# Patient Record
Sex: Female | Born: 2000 | Race: White | Hispanic: No | Marital: Single | Smoking: Never smoker
Health system: Southern US, Community
[De-identification: ages and names within clinical notes are randomized; demographics above are authoritative.]

## PROBLEM LIST (undated history)

## (undated) DIAGNOSIS — D649 Anemia, unspecified: Secondary | ICD-10-CM

## (undated) DIAGNOSIS — E559 Vitamin D deficiency, unspecified: Secondary | ICD-10-CM

## (undated) HISTORY — PX: NO PAST SURGERIES: SHX2092

## (undated) HISTORY — DX: Anemia, unspecified: D64.9

## (undated) HISTORY — DX: Vitamin D deficiency, unspecified: E55.9

---

## 2019-12-30 ENCOUNTER — Encounter (HOSPITAL_COMMUNITY): Payer: Self-pay

## 2019-12-30 ENCOUNTER — Ambulatory Visit (INDEPENDENT_AMBULATORY_CARE_PROVIDER_SITE_OTHER): Payer: Managed Care, Other (non HMO)

## 2019-12-30 ENCOUNTER — Ambulatory Visit (HOSPITAL_COMMUNITY)
Admission: EM | Admit: 2019-12-30 | Discharge: 2019-12-30 | Disposition: A | Discharge status: Home / Self Care | Payer: Managed Care, Other (non HMO) | Attending: Family Medicine | Admitting: Family Medicine

## 2019-12-30 DIAGNOSIS — S96912A Strain of unspecified muscle and tendon at ankle and foot level, left foot, initial encounter: Secondary | ICD-10-CM

## 2019-12-30 DIAGNOSIS — Z3202 Encounter for pregnancy test, result negative: Secondary | ICD-10-CM | POA: Diagnosis not present

## 2019-12-30 DIAGNOSIS — R600 Localized edema: Secondary | ICD-10-CM | POA: Diagnosis not present

## 2019-12-30 DIAGNOSIS — M79672 Pain in left foot: Secondary | ICD-10-CM | POA: Diagnosis not present

## 2019-12-30 LAB — POC URINE PREG, ED: Preg Test, Ur: NEGATIVE

## 2019-12-30 LAB — POCT PREGNANCY, URINE: Preg Test, Ur: NEGATIVE

## 2019-12-30 MED ORDER — METHYLPREDNISOLONE SODIUM SUCC 125 MG IJ SOLR
INTRAMUSCULAR | Status: AC
  Filled 2019-12-30: qty 2

## 2019-12-30 MED ORDER — METHYLPREDNISOLONE SODIUM SUCC 125 MG IJ SOLR
80.0000 mg | Freq: Once | INTRAMUSCULAR | Status: AC
  Administered 2019-12-30: 17:00:00 80 mg via INTRAMUSCULAR

## 2019-12-30 MED ORDER — KETOROLAC TROMETHAMINE 60 MG/2ML IM SOLN
INTRAMUSCULAR | Status: AC
  Filled 2019-12-30: qty 2

## 2019-12-30 MED ORDER — NAPROXEN 500 MG PO TABS: 500.0000 mg | ORAL_TABLET | Freq: Two times a day (BID) | ORAL | 0 refills | Status: DC | PRN

## 2019-12-30 MED ORDER — KETOROLAC TROMETHAMINE 60 MG/2ML IM SOLN
60.0000 mg | Freq: Once | INTRAMUSCULAR | Status: AC
  Administered 2019-12-30: 60 mg via INTRAMUSCULAR

## 2019-12-30 NOTE — ED Provider Notes (Signed)
[place]    CSN: [id number] Arrival date & time: 12/30/19  1247      History   Chief Complaint Chief Complaint  Patient presents with  . Foot Pain    Left    HPI Christine Wells is a 19 y.o. female.   HPI  Christine Wells presents with left foot pain following a slip and fall 3 days in which her body weight  landed on her left foot. She recalls hearing a "pop" during the injury. She did experience pain immediately following injury.  Today she is unable to bear-weight without experience significant discomfort. Left foot and ankle is negative for bruising. Left foot an ankle have become edematous since injury occurred. Pain is localized to the left  lateral, medial malleolus, tallus and hindfoot. She has not taken any medication today, although achieved temporary relief with ice applications during the night. No prior fracture or injury to left foot or ankle.  History reviewed. No pertinent past medical history.  There are no problems to display for this patient.   OB History   No obstetric history on file.      Home Medications    Prior to Admission medications   Not on File    Family History Family History  Problem Relation Age of Onset  . Hypertension Mother   . Healthy Father     Social History Social History   Tobacco Use  . Smoking status: Never Smoker  . Smokeless tobacco: Never Used  Substance Use Topics  . Alcohol use: Never  . Drug use: Not on file     Allergies   Patient has no known allergies.   Review of Systems Review of Systems Pertinent negatives listed in HPI  Physical Exam Triage Vital Signs ED Triage Vitals  Enc Vitals Group     BP 12/30/19 1431 132/84     Pulse Rate 12/30/19 1431 88     Resp 12/30/19 1431 16     Temp 12/30/19 1431 98.4 F (36.9 C)     Temp Source 12/30/19 1431 Oral     SpO2 12/30/19 1431 99 %     Weight --      Height --      Head Circumference --      Peak Flow --      Pain Score 12/30/19 1443  4     Pain Loc --      Pain Edu? --      Excl. in [place]? --    No data found.  Updated Vital Signs BP 132/84 (BP Location: Left Arm)   Pulse 88   Temp 98.4 F (36.9 C) (Oral)   Resp 16   SpO2 99%   Visual Acuity Right Eye Distance:   Left Eye Distance:   Bilateral Distance:    Right Eye Near:   Left Eye Near:    Bilateral Near:     Physical Exam   UC Treatments / Results  Labs (all labs ordered are listed, but only abnormal results are displayed) Labs Reviewed  POC URINE PREG, ED  POCT PREGNANCY, URINE    EKG   Radiology DG Foot Complete Left  Result Date: 12/30/2019 CLINICAL DATA:  Fall 3 days ago, pain with weight-bearing EXAM: LEFT FOOT - COMPLETE 3+ VIEW COMPARISON:  None. FINDINGS: No fracture or dislocation of the left foot. Joint spaces are well preserved. Mild soft tissue edema about the foot. IMPRESSION: No fracture or dislocation of the left foot. Mild soft  tissue edema. Electronically Signed   By: Lauralyn Primes M.D.   On: 12/30/2019 15:41    Procedures Procedures (including critical care time)  Medications Ordered in UC Medications  ketorolac (TORADOL) injection 60 mg (60 mg Intramuscular Given 12/30/19 1642)  methylPREDNISolone sodium succinate (SOLU-MEDROL) 125 mg/2 mL injection 80 mg (80 mg Intramuscular Given 12/30/19 1640)    Initial Impression / Assessment and Plan / UC Course  I have reviewed the triage vital signs and the nursing notes.  Pertinent labs & imaging results that were available during my care of the patient were reviewed by me and considered in my medical decision making (see chart for details).    Left ankle strain, Toradol and Solumedrol injections provided in office today to reduce inflammation and for management of pain. Prescribed Naproxen 500 mg BID, start tomorrow. Recommended RICE. If no improvement return for further evaluation. Patient verbalized understanding and agreement with plan. Work note provided.  Final Clinical  Impressions(s) / UC Diagnoses   Final diagnoses:  Left foot pain  Edema of left foot  Strain of left ankle, initial encounter     Discharge Instructions     Continue apply ice applications. I recommend refraining from weight-bearing activities for 48 hours. Work note provided. Take Naprosyn 500 mg twice daily as needed for pain (start tomorrow) you received antiinflammatory today in clinic today.    ED Prescriptions    Medication Sig Dispense Auth. Provider   naproxen (NAPROSYN) 500 MG tablet Take 1 tablet (500 mg total) by mouth 2 (two) times daily as needed. 30 tablet Bing Neighbors, FNP     PDMP not reviewed this encounter.   Bing Neighbors, [place] 01/01/20 2042

## 2019-12-30 NOTE — Discharge Instructions (Addendum)
Continue apply ice applications. I recommend refraining from weight-bearing activities for 48 hours. Work note provided. Take Naprosyn 500 mg twice daily as needed for pain (start tomorrow) you received antiinflammatory today in clinic today.

## 2019-12-30 NOTE — ED Triage Notes (Signed)
Patient presents to Urgent Care with complaints of left foot pain and swelling since slipping and landing on her foot three days ago. Patient reports it is painful to ambulate but she is able.

## 2020-10-09 IMAGING — DX DG FOOT COMPLETE 3+V*L*
3 series · 3 of 3 positions shown · non-contrast
Comparison: None.

CLINICAL DATA: Fall 3 days ago, pain with weight-bearing

EXAM:
LEFT FOOT - COMPLETE 3+ VIEW

[foot ap]
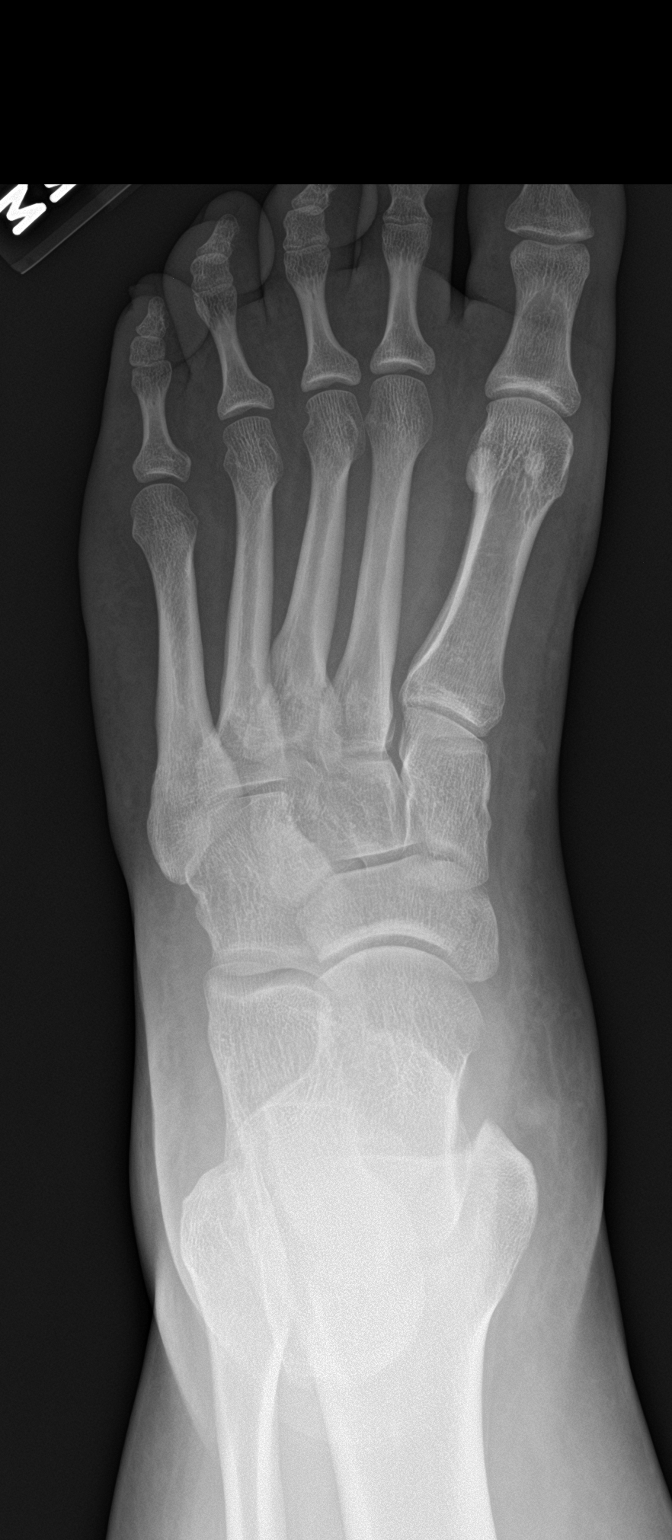

[foot obl]
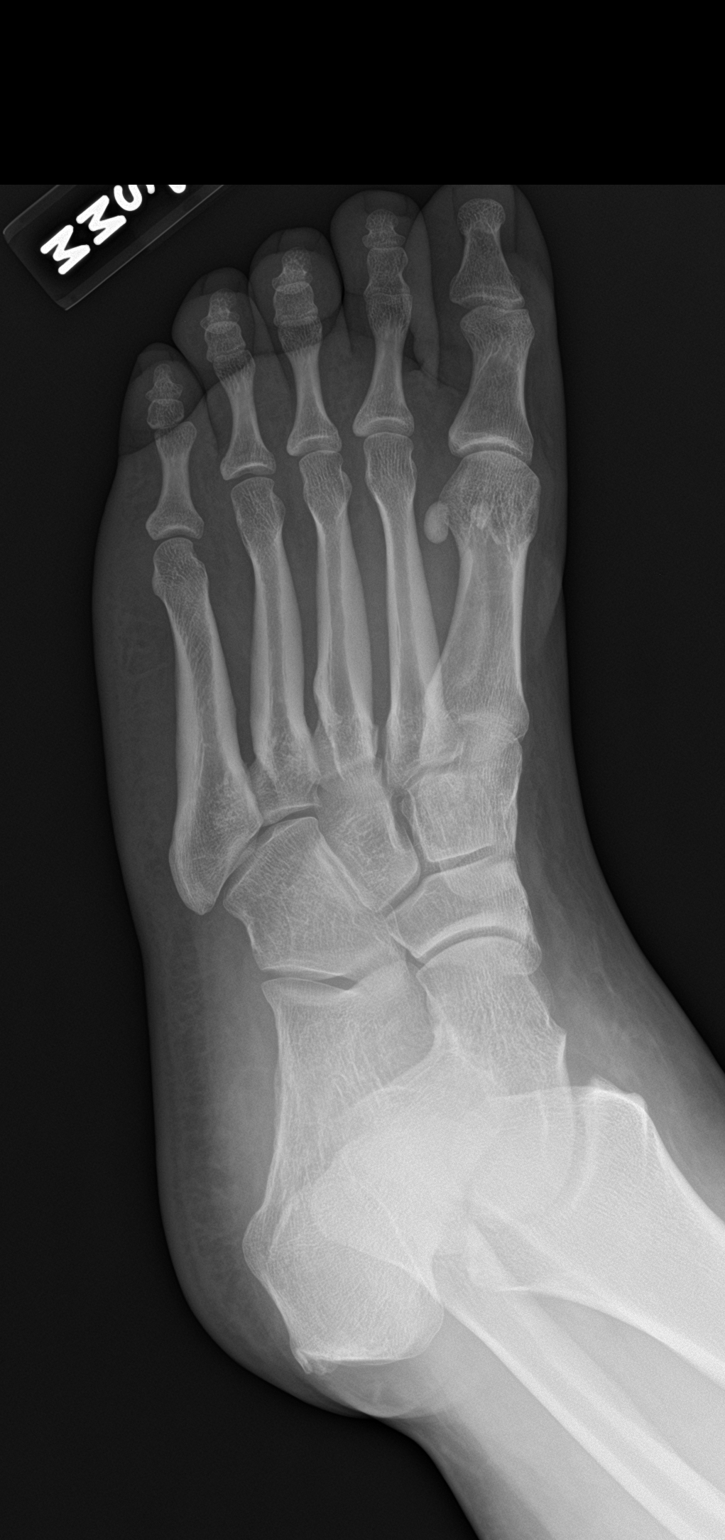

[foot lat]
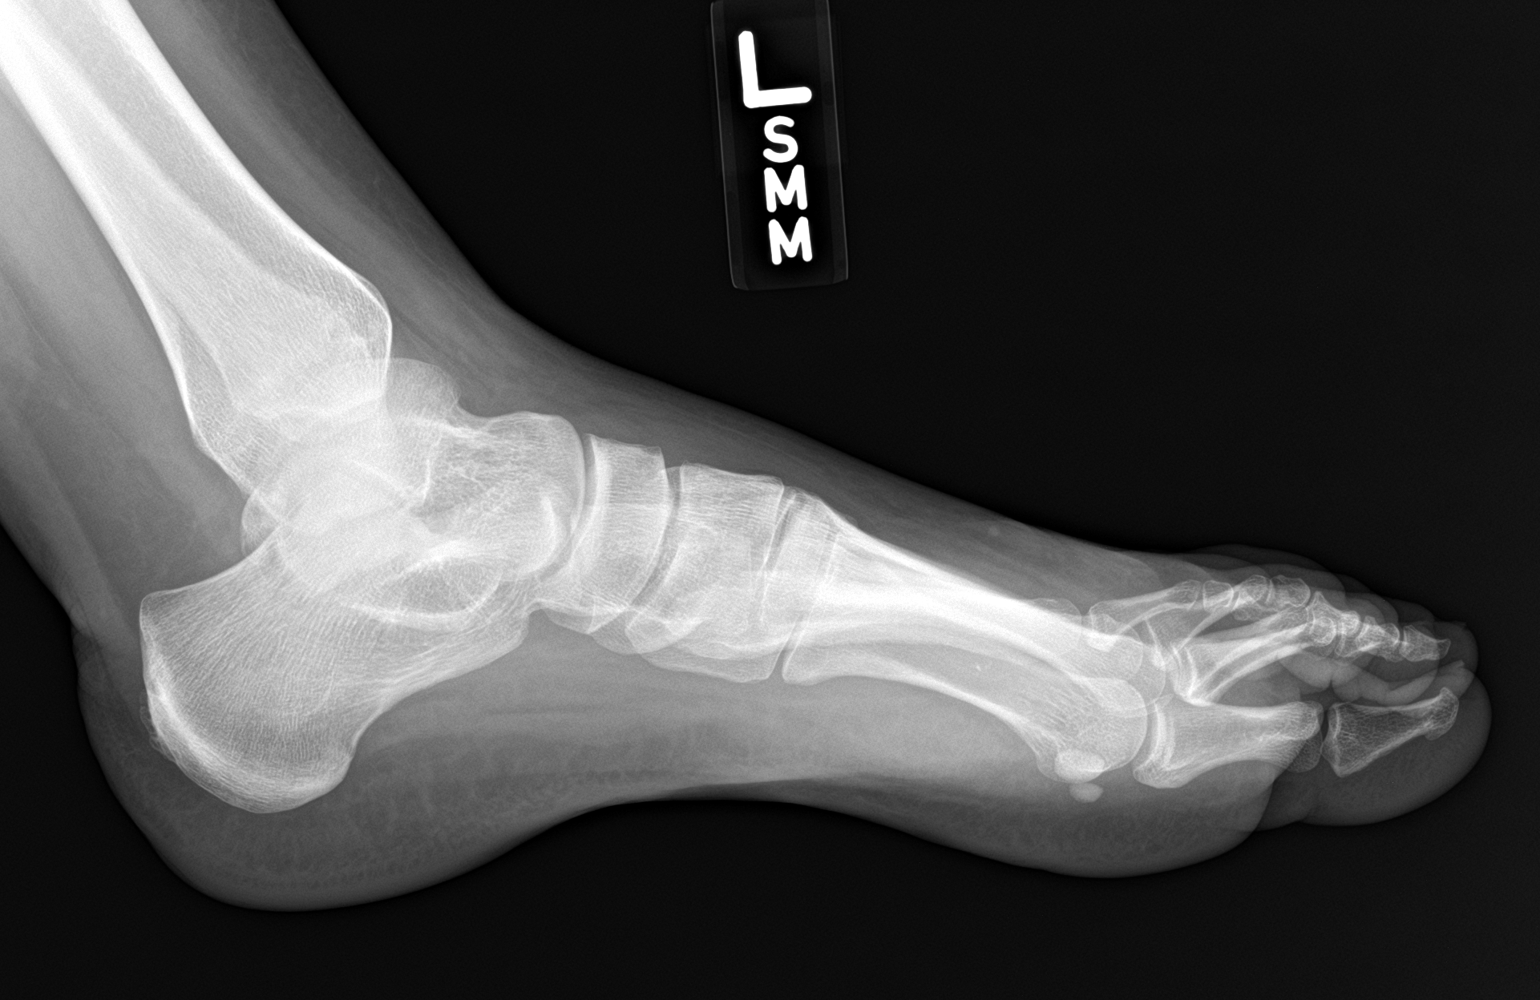

[3 of 3 positions shown; findings below may reference images not displayed]

FINDINGS: No fracture or dislocation of the left foot. Joint spaces are well
preserved. Mild soft tissue edema about the foot.
IMPRESSION: No fracture or dislocation of the left foot. Mild soft tissue edema.

## 2022-12-27 NOTE — L&D Delivery Note (Signed)
Delivery Note At 3:34 AM a viable female was delivered via Vaginal, Spontaneous (Presentation: Right Occiput Anterior).  APGAR: 8, 9; weight  7lbs 7.2 oz.    No nuchal cord noted.  Baby's nose and mouth suctioned at perineum.  Shoulders were delivered without difficulty, Left shoulder was anterior.  Upon delivery, baby was placed on mother's abdomen where it was dried and stimulated.  Pitocin was started at this point.  Baby had a spontaneous cry and good tone noted.  Cord was clamped after 1 minute. Cord cut by father of baby in the room.  Placenta delivered with gentle traction on cord and abdominal counter traction  Placenta status: Spontaneous, Intact.  Cord: 3 vessels with the following complications: None.  Cord pH: not collected.  Anesthesia:  Epidural. Episiotomy: None Lacerations: 1st degree Suture Repair: 3.0 vicryl Est. Blood Loss (mL): 200  Mom to postpartum.  Baby to Couplet care / Skin to Skin.  Prescilla Sours, MD.  10/06/2023, 3:54 AM

## 2023-02-25 DIAGNOSIS — Z419 Encounter for procedure for purposes other than remedying health state, unspecified: Secondary | ICD-10-CM | POA: Diagnosis not present

## 2023-03-21 DIAGNOSIS — Z124 Encounter for screening for malignant neoplasm of cervix: Secondary | ICD-10-CM | POA: Diagnosis not present

## 2023-03-21 DIAGNOSIS — N912 Amenorrhea, unspecified: Secondary | ICD-10-CM | POA: Diagnosis not present

## 2023-03-21 DIAGNOSIS — Z113 Encounter for screening for infections with a predominantly sexual mode of transmission: Secondary | ICD-10-CM | POA: Diagnosis not present

## 2023-03-21 DIAGNOSIS — Z362 Encounter for other antenatal screening follow-up: Secondary | ICD-10-CM | POA: Diagnosis not present

## 2023-03-21 DIAGNOSIS — Z3490 Encounter for supervision of normal pregnancy, unspecified, unspecified trimester: Secondary | ICD-10-CM | POA: Diagnosis not present

## 2023-03-21 LAB — HEPATITIS C ANTIBODY: HCV Ab: NEGATIVE

## 2023-03-21 LAB — OB RESULTS CONSOLE ABO/RH: RH Type: POSITIVE

## 2023-03-21 LAB — OB RESULTS CONSOLE HEPATITIS B SURFACE ANTIGEN: Hepatitis B Surface Ag: NEGATIVE

## 2023-03-21 LAB — OB RESULTS CONSOLE RUBELLA ANTIBODY, IGM: Rubella: IMMUNE

## 2023-03-21 LAB — OB RESULTS CONSOLE HIV ANTIBODY (ROUTINE TESTING): HIV: NONREACTIVE

## 2023-03-21 LAB — OB RESULTS CONSOLE RPR: RPR: NONREACTIVE

## 2023-03-21 LAB — OB RESULTS CONSOLE ANTIBODY SCREEN: Antibody Screen: NEGATIVE

## 2023-03-28 DIAGNOSIS — Z419 Encounter for procedure for purposes other than remedying health state, unspecified: Secondary | ICD-10-CM | POA: Diagnosis not present

## 2023-03-28 LAB — OB RESULTS CONSOLE GC/CHLAMYDIA
Chlamydia: NEGATIVE
Neisseria Gonorrhea: NEGATIVE

## 2023-04-27 DIAGNOSIS — Z419 Encounter for procedure for purposes other than remedying health state, unspecified: Secondary | ICD-10-CM | POA: Diagnosis not present

## 2023-04-29 ENCOUNTER — Other Ambulatory Visit: Payer: Self-pay

## 2023-04-29 ENCOUNTER — Other Ambulatory Visit: Payer: Self-pay | Admitting: Obstetrics and Gynecology

## 2023-04-29 DIAGNOSIS — Z6841 Body Mass Index (BMI) 40.0 and over, adult: Secondary | ICD-10-CM

## 2023-04-29 DIAGNOSIS — Z363 Encounter for antenatal screening for malformations: Secondary | ICD-10-CM

## 2023-04-29 DIAGNOSIS — Z3A21 21 weeks gestation of pregnancy: Secondary | ICD-10-CM

## 2023-05-05 ENCOUNTER — Other Ambulatory Visit: Payer: Self-pay

## 2023-05-17 ENCOUNTER — Other Ambulatory Visit: Payer: Self-pay

## 2023-05-28 DIAGNOSIS — Z419 Encounter for procedure for purposes other than remedying health state, unspecified: Secondary | ICD-10-CM | POA: Diagnosis not present

## 2023-05-30 ENCOUNTER — Encounter: Payer: Self-pay | Admitting: *Deleted

## 2023-05-30 DIAGNOSIS — O99210 Obesity complicating pregnancy, unspecified trimester: Secondary | ICD-10-CM | POA: Insufficient documentation

## 2023-06-02 ENCOUNTER — Ambulatory Visit: Payer: Managed Care, Other (non HMO)

## 2023-06-02 ENCOUNTER — Other Ambulatory Visit: Payer: Self-pay | Admitting: *Deleted

## 2023-06-02 ENCOUNTER — Ambulatory Visit: Payer: Managed Care, Other (non HMO) | Attending: Obstetrics and Gynecology

## 2023-06-02 ENCOUNTER — Encounter: Payer: Self-pay | Admitting: *Deleted

## 2023-06-02 VITALS — BP 126/74 | HR 86 | Ht 66.0 in

## 2023-06-02 DIAGNOSIS — Z3A21 21 weeks gestation of pregnancy: Secondary | ICD-10-CM | POA: Diagnosis not present

## 2023-06-02 DIAGNOSIS — O99210 Obesity complicating pregnancy, unspecified trimester: Secondary | ICD-10-CM | POA: Diagnosis present

## 2023-06-02 DIAGNOSIS — Z363 Encounter for antenatal screening for malformations: Secondary | ICD-10-CM | POA: Insufficient documentation

## 2023-06-02 DIAGNOSIS — O99212 Obesity complicating pregnancy, second trimester: Secondary | ICD-10-CM

## 2023-06-02 DIAGNOSIS — Z6841 Body Mass Index (BMI) 40.0 and over, adult: Secondary | ICD-10-CM | POA: Insufficient documentation

## 2023-06-27 DIAGNOSIS — Z419 Encounter for procedure for purposes other than remedying health state, unspecified: Secondary | ICD-10-CM | POA: Diagnosis not present

## 2023-07-07 ENCOUNTER — Ambulatory Visit: Payer: Managed Care, Other (non HMO) | Admitting: *Deleted

## 2023-07-07 ENCOUNTER — Ambulatory Visit: Payer: Managed Care, Other (non HMO) | Attending: Obstetrics

## 2023-07-07 ENCOUNTER — Other Ambulatory Visit: Payer: Self-pay | Admitting: *Deleted

## 2023-07-07 VITALS — BP 113/72 | HR 91

## 2023-07-07 DIAGNOSIS — O99012 Anemia complicating pregnancy, second trimester: Secondary | ICD-10-CM | POA: Diagnosis not present

## 2023-07-07 DIAGNOSIS — O99212 Obesity complicating pregnancy, second trimester: Secondary | ICD-10-CM

## 2023-07-07 DIAGNOSIS — E669 Obesity, unspecified: Secondary | ICD-10-CM | POA: Diagnosis not present

## 2023-07-07 DIAGNOSIS — D649 Anemia, unspecified: Secondary | ICD-10-CM

## 2023-07-07 DIAGNOSIS — Z3A26 26 weeks gestation of pregnancy: Secondary | ICD-10-CM

## 2023-07-28 DIAGNOSIS — Z419 Encounter for procedure for purposes other than remedying health state, unspecified: Secondary | ICD-10-CM | POA: Diagnosis not present

## 2023-08-04 ENCOUNTER — Ambulatory Visit: Payer: Managed Care, Other (non HMO) | Attending: Maternal & Fetal Medicine

## 2023-08-04 DIAGNOSIS — O99013 Anemia complicating pregnancy, third trimester: Secondary | ICD-10-CM | POA: Diagnosis not present

## 2023-08-04 DIAGNOSIS — D649 Anemia, unspecified: Secondary | ICD-10-CM | POA: Diagnosis not present

## 2023-08-04 DIAGNOSIS — O99012 Anemia complicating pregnancy, second trimester: Secondary | ICD-10-CM | POA: Insufficient documentation

## 2023-08-04 DIAGNOSIS — O99212 Obesity complicating pregnancy, second trimester: Secondary | ICD-10-CM | POA: Diagnosis present

## 2023-08-04 DIAGNOSIS — O99213 Obesity complicating pregnancy, third trimester: Secondary | ICD-10-CM | POA: Diagnosis not present

## 2023-08-04 DIAGNOSIS — E669 Obesity, unspecified: Secondary | ICD-10-CM | POA: Diagnosis not present

## 2023-08-04 DIAGNOSIS — Z3A30 30 weeks gestation of pregnancy: Secondary | ICD-10-CM

## 2023-08-28 DIAGNOSIS — Z419 Encounter for procedure for purposes other than remedying health state, unspecified: Secondary | ICD-10-CM | POA: Diagnosis not present

## 2023-09-01 ENCOUNTER — Other Ambulatory Visit: Payer: Self-pay | Admitting: *Deleted

## 2023-09-01 ENCOUNTER — Ambulatory Visit: Payer: Managed Care, Other (non HMO) | Attending: Maternal & Fetal Medicine

## 2023-09-01 DIAGNOSIS — Z3A34 34 weeks gestation of pregnancy: Secondary | ICD-10-CM

## 2023-09-01 DIAGNOSIS — O99013 Anemia complicating pregnancy, third trimester: Secondary | ICD-10-CM

## 2023-09-01 DIAGNOSIS — O99212 Obesity complicating pregnancy, second trimester: Secondary | ICD-10-CM | POA: Diagnosis present

## 2023-09-01 DIAGNOSIS — D649 Anemia, unspecified: Secondary | ICD-10-CM

## 2023-09-01 DIAGNOSIS — O99012 Anemia complicating pregnancy, second trimester: Secondary | ICD-10-CM | POA: Diagnosis present

## 2023-09-01 DIAGNOSIS — O3663X0 Maternal care for excessive fetal growth, third trimester, not applicable or unspecified: Secondary | ICD-10-CM | POA: Diagnosis not present

## 2023-09-01 DIAGNOSIS — O99213 Obesity complicating pregnancy, third trimester: Secondary | ICD-10-CM | POA: Diagnosis not present

## 2023-09-01 DIAGNOSIS — E669 Obesity, unspecified: Secondary | ICD-10-CM

## 2023-09-06 ENCOUNTER — Encounter: Payer: Self-pay | Admitting: *Deleted

## 2023-09-06 DIAGNOSIS — O3660X0 Maternal care for excessive fetal growth, unspecified trimester, not applicable or unspecified: Secondary | ICD-10-CM | POA: Insufficient documentation

## 2023-09-08 ENCOUNTER — Ambulatory Visit: Payer: Managed Care, Other (non HMO)

## 2023-09-09 ENCOUNTER — Other Ambulatory Visit: Payer: Managed Care, Other (non HMO)

## 2023-09-12 LAB — OB RESULTS CONSOLE GBS: GBS: POSITIVE

## 2023-09-15 ENCOUNTER — Ambulatory Visit: Payer: Managed Care, Other (non HMO) | Attending: Maternal & Fetal Medicine | Admitting: *Deleted

## 2023-09-15 DIAGNOSIS — O3663X0 Maternal care for excessive fetal growth, third trimester, not applicable or unspecified: Secondary | ICD-10-CM | POA: Diagnosis not present

## 2023-09-15 DIAGNOSIS — O3660X0 Maternal care for excessive fetal growth, unspecified trimester, not applicable or unspecified: Secondary | ICD-10-CM | POA: Diagnosis not present

## 2023-09-15 DIAGNOSIS — Z3A36 36 weeks gestation of pregnancy: Secondary | ICD-10-CM | POA: Diagnosis not present

## 2023-09-15 DIAGNOSIS — O99213 Obesity complicating pregnancy, third trimester: Secondary | ICD-10-CM | POA: Insufficient documentation

## 2023-09-15 NOTE — Procedures (Signed)
Christine Wells [birth year] [redacted]w[redacted]d  Fetus A Non-Stress Test Interpretation for 09/15/23-NST only  Indication:  LGA, OBESE  Fetal Heart Rate A Mode: External Baseline Rate (A): 140 bpm Variability: Moderate Accelerations: 15 x 15 Decelerations: None Multiple birth?: No  Uterine Activity Mode: Toco Contraction Frequency (min): none Resting Tone Palpated: Relaxed  Interpretation (Fetal Testing) Nonstress Test Interpretation: Reactive Comments: Tracing reviewed byDr. Parke Poisson

## 2023-09-22 ENCOUNTER — Ambulatory Visit: Payer: Managed Care, Other (non HMO) | Attending: Maternal & Fetal Medicine | Admitting: *Deleted

## 2023-09-22 DIAGNOSIS — Z3A37 37 weeks gestation of pregnancy: Secondary | ICD-10-CM | POA: Insufficient documentation

## 2023-09-22 DIAGNOSIS — O3663X0 Maternal care for excessive fetal growth, third trimester, not applicable or unspecified: Secondary | ICD-10-CM | POA: Insufficient documentation

## 2023-09-22 DIAGNOSIS — O99213 Obesity complicating pregnancy, third trimester: Secondary | ICD-10-CM | POA: Diagnosis present

## 2023-09-22 DIAGNOSIS — O3660X0 Maternal care for excessive fetal growth, unspecified trimester, not applicable or unspecified: Secondary | ICD-10-CM | POA: Diagnosis present

## 2023-09-22 NOTE — Procedures (Signed)
Christine Wells 2001/11/18 [redacted]w[redacted]d  Fetus A Non-Stress Test Interpretation for 09/22/23- NST only  Indication:  M.O.  and LGA  Fetal Heart Rate A Mode: External Baseline Rate (A): 140 bpm Variability: Moderate Accelerations: 15 x 15 Decelerations: None Multiple birth?: No  Uterine Activity Mode: Toco Contraction Frequency (min): occas with UI Contraction Duration (sec): 70 Contraction Quality: Mild Resting Tone Palpated: Relaxed  Interpretation (Fetal Testing) Nonstress Test Interpretation: Reactive Comments: Tracing reviewed byDr. Grace Bushy

## 2023-09-27 DIAGNOSIS — Z419 Encounter for procedure for purposes other than remedying health state, unspecified: Secondary | ICD-10-CM | POA: Diagnosis not present

## 2023-09-29 ENCOUNTER — Other Ambulatory Visit: Payer: Self-pay | Admitting: Obstetrics & Gynecology

## 2023-09-29 ENCOUNTER — Other Ambulatory Visit: Payer: Self-pay | Admitting: *Deleted

## 2023-09-29 ENCOUNTER — Ambulatory Visit: Payer: Managed Care, Other (non HMO) | Attending: Obstetrics

## 2023-09-29 DIAGNOSIS — E669 Obesity, unspecified: Secondary | ICD-10-CM

## 2023-09-29 DIAGNOSIS — Z3A38 38 weeks gestation of pregnancy: Secondary | ICD-10-CM

## 2023-09-29 DIAGNOSIS — O99013 Anemia complicating pregnancy, third trimester: Secondary | ICD-10-CM

## 2023-09-29 DIAGNOSIS — D649 Anemia, unspecified: Secondary | ICD-10-CM

## 2023-09-29 DIAGNOSIS — O3663X0 Maternal care for excessive fetal growth, third trimester, not applicable or unspecified: Secondary | ICD-10-CM

## 2023-09-29 DIAGNOSIS — O99213 Obesity complicating pregnancy, third trimester: Secondary | ICD-10-CM

## 2023-09-30 ENCOUNTER — Telehealth (HOSPITAL_COMMUNITY): Payer: Self-pay | Admitting: *Deleted

## 2023-10-03 NOTE — Telephone Encounter (Signed)
Preadmission screen  

## 2023-10-05 ENCOUNTER — Inpatient Hospital Stay (HOSPITAL_COMMUNITY): Payer: Managed Care, Other (non HMO)

## 2023-10-05 ENCOUNTER — Inpatient Hospital Stay (HOSPITAL_COMMUNITY): Payer: Managed Care, Other (non HMO) | Admitting: Anesthesiology

## 2023-10-05 ENCOUNTER — Ambulatory Visit: Payer: Managed Care, Other (non HMO)

## 2023-10-05 ENCOUNTER — Encounter (HOSPITAL_COMMUNITY): Payer: Self-pay | Admitting: Obstetrics & Gynecology

## 2023-10-05 ENCOUNTER — Inpatient Hospital Stay (HOSPITAL_COMMUNITY)
Admission: AD | Admit: 2023-10-05 | Discharge: 2023-10-08 | DRG: 807 | Disposition: A | Discharge status: Home / Self Care | Payer: Managed Care, Other (non HMO) | Attending: Obstetrics & Gynecology | Admitting: Obstetrics & Gynecology

## 2023-10-05 DIAGNOSIS — Z8249 Family history of ischemic heart disease and other diseases of the circulatory system: Secondary | ICD-10-CM

## 2023-10-05 DIAGNOSIS — O99824 Streptococcus B carrier state complicating childbirth: Secondary | ICD-10-CM | POA: Diagnosis present

## 2023-10-05 DIAGNOSIS — O99214 Obesity complicating childbirth: Secondary | ICD-10-CM | POA: Diagnosis present

## 2023-10-05 DIAGNOSIS — O3660X0 Maternal care for excessive fetal growth, unspecified trimester, not applicable or unspecified: Secondary | ICD-10-CM | POA: Diagnosis present

## 2023-10-05 DIAGNOSIS — O135 Gestational [pregnancy-induced] hypertension without significant proteinuria, complicating the puerperium: Secondary | ICD-10-CM | POA: Diagnosis not present

## 2023-10-05 DIAGNOSIS — Z3A39 39 weeks gestation of pregnancy: Secondary | ICD-10-CM | POA: Diagnosis not present

## 2023-10-05 DIAGNOSIS — O3663X0 Maternal care for excessive fetal growth, third trimester, not applicable or unspecified: Secondary | ICD-10-CM | POA: Diagnosis present

## 2023-10-05 DIAGNOSIS — Z23 Encounter for immunization: Secondary | ICD-10-CM

## 2023-10-05 DIAGNOSIS — O9902 Anemia complicating childbirth: Secondary | ICD-10-CM | POA: Diagnosis present

## 2023-10-05 LAB — COMPREHENSIVE METABOLIC PANEL
ALT: 13 U/L (ref 0–44)
AST: 13 U/L — ABNORMAL LOW (ref 15–41)
Albumin: 2.3 g/dL — ABNORMAL LOW (ref 3.5–5.0)
Alkaline Phosphatase: 266 U/L — ABNORMAL HIGH (ref 38–126)
Anion gap: 9 (ref 5–15)
BUN: 7 mg/dL (ref 6–20)
CO2: 22 mmol/L (ref 22–32)
Calcium: 9.3 mg/dL (ref 8.9–10.3)
Chloride: 106 mmol/L (ref 98–111)
Creatinine, Ser: 0.71 mg/dL (ref 0.44–1.00)
GFR, Estimated: >60 mL/min (ref >60)
Glucose, Bld: 95 mg/dL (ref 70–99)
Potassium: 3.8 mmol/L (ref 3.5–5.1)
Sodium: 137 mmol/L (ref 135–145)
Total Bilirubin: 0.1 mg/dL — ABNORMAL LOW (ref 0.3–1.2)
Total Protein: 6.3 g/dL — ABNORMAL LOW (ref 6.5–8.1)

## 2023-10-05 LAB — CBC
HCT: 35.9 % — ABNORMAL LOW (ref 36.0–46.0)
HCT: 34.5 % — ABNORMAL LOW (ref 36.0–46.0)
Hemoglobin: 11.1 g/dL — ABNORMAL LOW (ref 12.0–15.0)
Hemoglobin: 10.8 g/dL — ABNORMAL LOW (ref 12.0–15.0)
MCH: 22.6 pg — ABNORMAL LOW (ref 26.0–34.0)
MCH: 22.9 pg — ABNORMAL LOW (ref 26.0–34.0)
MCHC: 30.9 g/dL (ref 30.0–36.0)
MCHC: 31.3 g/dL (ref 30.0–36.0)
MCV: 73 fL — ABNORMAL LOW (ref 80.0–100.0)
MCV: 73.2 fL — ABNORMAL LOW (ref 80.0–100.0)
Platelets: 267 10*3/uL (ref 150–400)
Platelets: 230 10*3/uL (ref 150–400)
RBC: 4.92 MIL/uL (ref 3.87–5.11)
RBC: 4.71 MIL/uL (ref 3.87–5.11)
RDW: 16 % — ABNORMAL HIGH (ref 11.5–15.5)
RDW: 16.2 % — ABNORMAL HIGH (ref 11.5–15.5)
WBC: 11 10*3/uL — ABNORMAL HIGH (ref 4.0–10.5)
WBC: 11.6 10*3/uL — ABNORMAL HIGH (ref 4.0–10.5)
nRBC: 0 % (ref 0.0–0.2)
nRBC: 0 % (ref 0.0–0.2)

## 2023-10-05 LAB — PROTEIN / CREATININE RATIO, URINE
Creatinine, Urine: 67 mg/dL
Protein Creatinine Ratio: 0.13 mg/mg{creat} (ref 0.00–0.15)
Total Protein, Urine: 9 mg/dL

## 2023-10-05 LAB — TYPE AND SCREEN
ABO/RH(D): A POS
Antibody Screen: NEGATIVE

## 2023-10-05 LAB — RPR: RPR Ser Ql: NONREACTIVE

## 2023-10-05 MED ORDER — FLEET ENEMA RE ENEM: 1.0000 | ENEMA | RECTAL | Status: DC | PRN

## 2023-10-05 MED ORDER — ONDANSETRON HCL 4 MG/2ML IJ SOLN: 4.0000 mg | Freq: Four times a day (QID) | INTRAMUSCULAR | Status: DC | PRN

## 2023-10-05 MED ORDER — SOD CITRATE-CITRIC ACID 500-334 MG/5ML PO SOLN: 30.0000 mL | ORAL | Status: DC | PRN

## 2023-10-05 MED ORDER — LACTATED RINGERS IV SOLN: 500.0000 mL | Freq: Once | INTRAVENOUS | Status: AC

## 2023-10-05 MED ORDER — TERBUTALINE SULFATE 1 MG/ML IJ SOLN: 0.2500 mg | Freq: Once | INTRAMUSCULAR | Status: DC | PRN

## 2023-10-05 MED ORDER — ACETAMINOPHEN 325 MG PO TABS: 650.0000 mg | ORAL_TABLET | ORAL | Status: DC | PRN

## 2023-10-05 MED ORDER — LIDOCAINE HCL (PF) 1 % IJ SOLN
INTRAMUSCULAR | Status: DC | PRN
  Administered 2023-10-05 (×2): 5 mL via EPIDURAL

## 2023-10-05 MED ORDER — OXYTOCIN-SODIUM CHLORIDE 30-0.9 UT/500ML-% IV SOLN
2.5000 [IU]/h | INTRAVENOUS | Status: DC
  Administered 2023-10-06: 2.5 [IU]/h via INTRAVENOUS

## 2023-10-05 MED ORDER — PHENYLEPHRINE 80 MCG/ML (10ML) SYRINGE FOR IV PUSH (FOR BLOOD PRESSURE SUPPORT): 80.0000 ug | PREFILLED_SYRINGE | INTRAVENOUS | Status: DC | PRN

## 2023-10-05 MED ORDER — FENTANYL-BUPIVACAINE-NACL 0.5-0.125-0.9 MG/250ML-% EP SOLN
12.0000 mL/h | EPIDURAL | Status: DC | PRN
  Administered 2023-10-05: 12 mL/h via EPIDURAL
  Filled 2023-10-05: qty 250

## 2023-10-05 MED ORDER — FENTANYL CITRATE (PF) 100 MCG/2ML IJ SOLN: 50.0000 ug | INTRAMUSCULAR | Status: DC | PRN

## 2023-10-05 MED ORDER — OXYTOCIN BOLUS FROM INFUSION
333.0000 mL | Freq: Once | INTRAVENOUS | Status: AC
  Administered 2023-10-06: 333 mL via INTRAVENOUS

## 2023-10-05 MED ORDER — SODIUM CHLORIDE 0.9 % IV SOLN
5.0000 10*6.[IU] | Freq: Once | INTRAVENOUS | Status: AC
  Administered 2023-10-05: 5 10*6.[IU] via INTRAVENOUS
  Filled 2023-10-05: qty 5

## 2023-10-05 MED ORDER — PENICILLIN G POT IN DEXTROSE 60000 UNIT/ML IV SOLN
3.0000 10*6.[IU] | INTRAVENOUS | Status: DC
  Administered 2023-10-05 – 2023-10-06 (×5): 3 10*6.[IU] via INTRAVENOUS
  Filled 2023-10-05 (×8): qty 50

## 2023-10-05 MED ORDER — EPHEDRINE 5 MG/ML INJ: 10.0000 mg | INTRAVENOUS | Status: DC | PRN

## 2023-10-05 MED ORDER — LACTATED RINGERS IV SOLN: 500.0000 mL | INTRAVENOUS | Status: AC | PRN

## 2023-10-05 MED ORDER — OXYTOCIN-SODIUM CHLORIDE 30-0.9 UT/500ML-% IV SOLN
1.0000 m[IU]/min | INTRAVENOUS | Status: DC
  Administered 2023-10-05: 1 m[IU]/min via INTRAVENOUS
  Filled 2023-10-05: qty 500

## 2023-10-05 MED ORDER — OXYCODONE-ACETAMINOPHEN 5-325 MG PO TABS: 1.0000 | ORAL_TABLET | ORAL | Status: DC | PRN

## 2023-10-05 MED ORDER — OXYCODONE-ACETAMINOPHEN 5-325 MG PO TABS: 2.0000 | ORAL_TABLET | ORAL | Status: DC | PRN

## 2023-10-05 MED ORDER — LIDOCAINE HCL (PF) 1 % IJ SOLN: 30.0000 mL | INTRAMUSCULAR | Status: DC | PRN

## 2023-10-05 MED ORDER — LACTATED RINGERS IV SOLN: INTRAVENOUS | Status: AC

## 2023-10-05 MED ORDER — DIPHENHYDRAMINE HCL 50 MG/ML IJ SOLN: 12.5000 mg | INTRAMUSCULAR | Status: DC | PRN

## 2023-10-05 MED ORDER — MISOPROSTOL 25 MCG QUARTER TABLET
25.0000 ug | ORAL_TABLET | ORAL | Status: AC | PRN
  Administered 2023-10-05 (×3): 25 ug via VAGINAL
  Filled 2023-10-05 (×3): qty 1

## 2023-10-05 NOTE — Progress Notes (Addendum)
Vicci Reder is a 22 y.o. G1P0 at [redacted]w[redacted]d admitted for induction of labor for high maternal BMI,   Subjective:  Patient with pain on left arm near IV site.   Objective: BP (!) 142/77   Pulse 86   Temp 98.3 F (36.8 C) (Oral)   Resp 18   Ht 5\' 6"  (1.676 m)   Wt (!) 144.2 kg   LMP 01/04/2023   BMI 51.31 kg/m  No intake/output data recorded. No intake/output data recorded.    10/05/2023    7:05 AM 10/05/2023    4:05 AM 10/05/2023    1:26 AM  Vitals with BMI  Height  5\' 6"    Weight  317 lbs 14 oz   BMI  51.33   Systolic 142  137  Diastolic 77  87  Pulse 86  107    FHT:  FHR: 130 bpm, variability: moderate,  accelerations:  Present,  decelerations:  Absent UC:   irregular, every 2 to 6 minutes SVE:   Dilation: 2 Effacement (%): 80 Station: -2 Exam by:: Hazle Quant, RN  Labs: Lab Results  Component Value Date   WBC 11.0 (H) 10/05/2023   HGB 11.1 (L) 10/05/2023   HCT 35.9 (L) 10/05/2023   MCV 73.0 (L) 10/05/2023   PLT 267 10/05/2023   CMP     Component Value Date/Time   NA 137 10/05/2023 1337   K 3.8 10/05/2023 1337   CL 106 10/05/2023 1337   CO2 22 10/05/2023 1337   GLUCOSE 95 10/05/2023 1337   BUN 7 10/05/2023 1337   CREATININE 0.71 10/05/2023 1337   CALCIUM 9.3 10/05/2023 1337   PROT 6.3 (L) 10/05/2023 1337   ALBUMIN 2.3 (L) 10/05/2023 1337   AST 13 (L) 10/05/2023 1337   ALT 13 10/05/2023 1337   ALKPHOS 266 (H) 10/05/2023 1337   BILITOT 0.1 (L) 10/05/2023 1337   GFRNONAA >60 10/05/2023 1337     Assessment / Plan: 22 y.o. G1P0 at [redacted]w[redacted]d admitted for induction of labor for high maternal BMI,   Labor:  S/p 3 vaginal cytotecs, plan for pitocin start 4 hours after last vaginal cytotec.  Preeclampsia:   With elevated blood pressures, check preeclampsia labs.  Fetal Wellbeing:  Category I Pain Control:  Epidural, IV pain meds, and Nitrous Oxide as desired.  I/D:   GBS positive on penicillin.  Anticipated MOD:  NSVD  Prescilla Sours, MD 10/05/2023, 12:52 PM

## 2023-10-05 NOTE — Plan of Care (Signed)

## 2023-10-05 NOTE — H&P (Addendum)
Christine Wells is a 22 y.o. female presenting for IOL.  OB History     Gravida  1   Para      Term      Preterm      AB      Living         SAB      IAB      Ectopic      Multiple      Live Births             Past Medical History:  Diagnosis Date   Anemia    Vitamin D deficiency    Past Surgical History:  Procedure Laterality Date   NO PAST SURGERIES     Family History: family history includes Healthy in her father; Hypertension in her mother. Social History:  reports that she has never smoked. She has never used smokeless tobacco. She reports that she does not drink alcohol and does not use drugs.     Maternal Diabetes: No Genetic Screening: Normal Maternal Ultrasounds/Referrals: Normal Fetal Ultrasounds or other Referrals:  None Maternal Substance Abuse:  No Significant Maternal Medications:  Meds include: Other: aspirin Significant Maternal Lab Results:  Group B Strep positive Number of Prenatal Visits:greater than 3 verified prenatal visits Maternal Vaccinations:TDap 09-08-23 Other Comments:  None  Review of Systems No F/C/N/V/D  History   Last menstrual period 01/04/2023. Exam Physical Exam  Lungs unlabored CV RRR Abdomen gravid, NT Exxtremities no calf tenderness  VE per RN closed/50%/-3 FHT 140, + accels, no decels, mod variability Toco rare  Prenatal labs: ABO, Rh: A/Positive/-- (03/25 0000) Antibody: Negative (03/25 0000) Rubella: Immune (03/25 0000) RPR: Nonreactive (03/25 0000)  HBsAg: Negative (03/25 0000)  HIV: Non-reactive (03/25 0000)  GBS: Positive/-- (09/16 0000)   Ultrasound 09-29-23 8lbs 9oz, 92% and AC >99%  Assessment/Plan: 22yo G1P0 at 39 1/7 wks being admitted for IOL d/t BMI 51 and LGA.   Bedside ultrasound ordered to check presentation.  Unfavorable cervix, will start with cervical ripening.  Fetal status overall reassuring with cat 1 tracing.   Purcell Nails 10/05/2023, 1:33 AM

## 2023-10-05 NOTE — Progress Notes (Addendum)
Christine Wells is a 22 y.o. G1P0 at 107w1d admitted for induction of labor for maternal obesity  Chart reviewed remotely and phone call made to RN.   Objective: BP (!) 141/102   Pulse 70   Temp 98 F (36.7 C) (Oral)   Resp 18   Ht 5\' 6"  (1.676 m)   Wt (!) 144.2 kg   LMP 01/04/2023   SpO2 100%   BMI 51.31 kg/m  No intake/output data recorded. No intake/output data recorded.    10/05/2023    6:19 PM 10/05/2023    6:01 PM 10/05/2023    5:56 PM  Vitals with BMI  Systolic 141 130 161  Diastolic 102 78 81  Pulse 70 70 69    FHT:  FHR: 120 bpm, variability: moderate,  accelerations:  Present,  decelerations:  Absent UC:   irregular, every 2 to 3 minutes, low amplitude, irregular.  SVE:   Dilation: 3 Effacement (%): 80 Station: -3 Exam by:: Alethia Berthold RN Ballotable head per Charity fundraiser.   Labs: Lab Results  Component Value Date   WBC 11.6 (H) 10/05/2023   HGB 10.8 (L) 10/05/2023   HCT 34.5 (L) 10/05/2023   MCV 73.2 (L) 10/05/2023   PLT 230 10/05/2023   CMP     Component Value Date/Time   NA 137 10/05/2023 1337   K 3.8 10/05/2023 1337   CL 106 10/05/2023 1337   CO2 22 10/05/2023 1337   GLUCOSE 95 10/05/2023 1337   BUN 7 10/05/2023 1337   CREATININE 0.71 10/05/2023 1337   CALCIUM 9.3 10/05/2023 1337   PROT 6.3 (L) 10/05/2023 1337   ALBUMIN 2.3 (L) 10/05/2023 1337   AST 13 (L) 10/05/2023 1337   ALT 13 10/05/2023 1337   ALKPHOS 266 (H) 10/05/2023 1337   BILITOT 0.1 (L) 10/05/2023 1337   GFRNONAA >60 10/05/2023 1337    10/05/23:   Result Notes    Component Ref Range & Units 14:24  Creatinine, Urine mg/dL 67  Total Protein, Urine mg/dL 9  Comment: NO NORMAL RANGE ESTABLISHED FOR THIS TEST  Protein Creatinine Ratio 0.00 - 0.15 mg/mgCre 0.13      Assessment / Plan: 22 y.o. G1P0 at [redacted]w[redacted]d admitted for induction of labor for  maternal obesity,   Labor:  S/p 3 vaginal cytotecs, on pitocin now.    Preeclampsia:   With gestational HTN, negative preeclampsia labs.   Fetal Wellbeing:  Category I Pain Control:  Already has an epidural.  I/D:   GBS positive on penicillin.  Anticipated MOD:  NSVD.  Prescilla Sours, MD. 10/05/2023, 6:34 PM.

## 2023-10-05 NOTE — Anesthesia Procedure Notes (Signed)
Epidural Patient location during procedure: OB Start time: 10/05/2023 5:35 PM End time: 10/05/2023 5:45 PM  Staffing Anesthesiologist: Mal Amabile, MD Performed: anesthesiologist   Preanesthetic Checklist Completed: patient identified, IV checked, site marked, risks and benefits discussed, surgical consent, monitors and equipment checked, pre-op evaluation and timeout performed  Epidural Patient position: sitting Prep: DuraPrep and site prepped and draped Patient monitoring: continuous pulse ox and blood pressure Approach: midline Location: L3-L4 Injection technique: LOR air  Needle:  Needle type: Tuohy  Needle gauge: 17 G Needle length: 9 cm and 9 Needle insertion depth: 5 cm cm Catheter type: closed end flexible Catheter size: 19 Gauge Catheter at skin depth: 15 cm Test dose: negative and Other  Assessment Events: blood not aspirated, no cerebrospinal fluid, injection not painful, no injection resistance, no paresthesia and negative IV test  Additional Notes Patient identified. Risks and benefits discussed including failed block, incomplete  Pain control, post dural puncture headache, nerve damage, paralysis, blood pressure Changes, nausea, vomiting, reactions to medications-both toxic and allergic and post Partum back pain. All questions were answered. Patient expressed understanding and wished to proceed. Sterile technique was used throughout procedure. Epidural site was Dressed with sterile barrier dressing. No paresthesias, signs of intravascular injection Or signs of intrathecal spread were encountered.  Patient was more comfortable after the epidural was dosed. Please see RN's note for documentation of vital signs and FHR which are stable. Reason for block:procedure for pain

## 2023-10-05 NOTE — Anesthesia Preprocedure Evaluation (Signed)
Anesthesia Evaluation  Patient identified by MRN, date of birth, ID band Patient awake    Reviewed: Allergy & Precautions, Patient's Chart, lab work & pertinent test results  Airway Mallampati: II       Dental no notable dental hx.    Pulmonary neg pulmonary ROS   Pulmonary exam normal        Cardiovascular negative cardio ROS Normal cardiovascular exam Rhythm:Regular     Neuro/Psych negative neurological ROS  negative psych ROS   GI/Hepatic Neg liver ROS,GERD  ,,  Endo/Other  Super MO  Renal/GU negative Renal ROS  negative genitourinary   Musculoskeletal negative musculoskeletal ROS (+)    Abdominal  (+) + obese  Peds  Hematology  (+) Blood dyscrasia, anemia   Anesthesia Other Findings   Reproductive/Obstetrics (+) Pregnancy                             Anesthesia Physical Anesthesia Plan  ASA: 3  Anesthesia Plan: Epidural   Post-op Pain Management: Minimal or no pain anticipated   Induction:   PONV Risk Score and Plan:   Airway Management Planned: Natural Airway  Additional Equipment: None and Fetal Monitoring  Intra-op Plan:   Post-operative Plan:   Informed Consent: I have reviewed the patients History and Physical, chart, labs and discussed the procedure including the risks, benefits and alternatives for the proposed anesthesia with the patient or authorized representative who has indicated his/her understanding and acceptance.       Plan Discussed with: Anesthesiologist  Anesthesia Plan Comments:        Anesthesia Quick Evaluation

## 2023-10-06 ENCOUNTER — Encounter (HOSPITAL_COMMUNITY): Payer: Self-pay | Admitting: Obstetrics and Gynecology

## 2023-10-06 MED ORDER — OXYCODONE HCL 5 MG PO TABS: 10.0000 mg | ORAL_TABLET | ORAL | Status: DC | PRN

## 2023-10-06 MED ORDER — ONDANSETRON HCL 4 MG/2ML IJ SOLN: 4.0000 mg | INTRAMUSCULAR | Status: DC | PRN

## 2023-10-06 MED ORDER — BENZOCAINE-MENTHOL 20-0.5 % EX AERO
1.0000 | INHALATION_SPRAY | CUTANEOUS | Status: DC | PRN
  Administered 2023-10-07: 1 via TOPICAL
  Filled 2023-10-06: qty 56

## 2023-10-06 MED ORDER — BISACODYL 10 MG RE SUPP: 10.0000 mg | Freq: Once | RECTAL | Status: DC | PRN

## 2023-10-06 MED ORDER — SENNOSIDES-DOCUSATE SODIUM 8.6-50 MG PO TABS
2.0000 | ORAL_TABLET | Freq: Every day | ORAL | Status: DC
  Administered 2023-10-06 – 2023-10-08 (×3): 2 via ORAL
  Filled 2023-10-06 (×3): qty 2

## 2023-10-06 MED ORDER — OXYCODONE HCL 5 MG PO TABS: 5.0000 mg | ORAL_TABLET | ORAL | Status: DC | PRN

## 2023-10-06 MED ORDER — SODIUM CHLORIDE 0.9% FLUSH: 3.0000 mL | INTRAVENOUS | Status: DC | PRN

## 2023-10-06 MED ORDER — DIBUCAINE (PERIANAL) 1 % EX OINT: 1.0000 | TOPICAL_OINTMENT | CUTANEOUS | Status: DC | PRN

## 2023-10-06 MED ORDER — ACETAMINOPHEN 325 MG PO TABS
650.0000 mg | ORAL_TABLET | ORAL | Status: DC | PRN
  Administered 2023-10-08: 650 mg via ORAL
  Filled 2023-10-06: qty 2

## 2023-10-06 MED ORDER — MAGNESIUM HYDROXIDE 400 MG/5ML PO SUSP: 30.0000 mL | ORAL | Status: DC | PRN

## 2023-10-06 MED ORDER — ONDANSETRON HCL 4 MG PO TABS: 4.0000 mg | ORAL_TABLET | ORAL | Status: DC | PRN

## 2023-10-06 MED ORDER — DIPHENHYDRAMINE HCL 25 MG PO CAPS: 25.0000 mg | ORAL_CAPSULE | Freq: Four times a day (QID) | ORAL | Status: DC | PRN

## 2023-10-06 MED ORDER — COCONUT OIL OIL
1.0000 | TOPICAL_OIL | Status: DC | PRN
  Administered 2023-10-07: 1 via TOPICAL

## 2023-10-06 MED ORDER — SODIUM CHLORIDE 0.9% FLUSH
3.0000 mL | Freq: Two times a day (BID) | INTRAVENOUS | Status: DC
  Administered 2023-10-06 – 2023-10-07 (×3): 3 mL via INTRAVENOUS

## 2023-10-06 MED ORDER — SIMETHICONE 80 MG PO CHEW: 80.0000 mg | CHEWABLE_TABLET | ORAL | Status: DC | PRN

## 2023-10-06 MED ORDER — ZOLPIDEM TARTRATE 5 MG PO TABS: 5.0000 mg | ORAL_TABLET | Freq: Every evening | ORAL | Status: DC | PRN

## 2023-10-06 MED ORDER — PRENATAL MULTIVITAMIN CH
1.0000 | ORAL_TABLET | Freq: Every day | ORAL | Status: DC
  Administered 2023-10-06 – 2023-10-08 (×3): 1 via ORAL
  Filled 2023-10-06 (×3): qty 1

## 2023-10-06 MED ORDER — WITCH HAZEL-GLYCERIN EX PADS: 1.0000 | MEDICATED_PAD | CUTANEOUS | Status: DC | PRN

## 2023-10-06 MED ORDER — SODIUM CHLORIDE 0.9 % IV SOLN: 250.0000 mL | INTRAVENOUS | Status: AC | PRN

## 2023-10-06 MED ORDER — IBUPROFEN 600 MG PO TABS
600.0000 mg | ORAL_TABLET | Freq: Four times a day (QID) | ORAL | Status: DC
  Administered 2023-10-06 – 2023-10-08 (×8): 600 mg via ORAL
  Filled 2023-10-06 (×9): qty 1

## 2023-10-06 MED ORDER — OXYTOCIN-SODIUM CHLORIDE 30-0.9 UT/500ML-% IV SOLN: 2.5000 [IU]/h | INTRAVENOUS | Status: DC | PRN

## 2023-10-06 NOTE — Anesthesia Postprocedure Evaluation (Signed)
Anesthesia Post Note  Patient: Christine Wells  Procedure(s) Performed: AN AD HOC LABOR EPIDURAL     Patient location during evaluation: Mother Baby Anesthesia Type: Epidural Level of consciousness: awake and alert and oriented Pain management: satisfactory to patient Vital Signs Assessment: post-procedure vital signs reviewed and stable Respiratory status: respiratory function stable Cardiovascular status: stable Postop Assessment: no headache, no backache, epidural receding, patient able to bend at knees, no signs of nausea or vomiting, adequate PO intake and able to ambulate Anesthetic complications: no   No notable events documented.  Last Vitals:  Vitals:   10/06/23 1325 10/06/23 1622  BP: 109/79 119/73  Pulse: 84 85  Resp: 19 19  Temp: 36.4 C 36.6 C  SpO2: 97% 97%    Last Pain:  Vitals:   10/06/23 1721  TempSrc:   PainSc: 0-No pain   Pain Goal:                   [name]

## 2023-10-06 NOTE — Plan of Care (Signed)

## 2023-10-06 NOTE — Lactation Note (Signed)
This note was copied from a baby's chart. Lactation Consultation Note  Patient Name: Christine Wells [id number] Date: 10/06/2023 Age:24 hours Reason for consult: Initial assessment;Primapara;Term Mom states she wants to pump and bottle feed. But for a few days she wants to put the baby to the breast to let her breast know what the baby needs. Set up DEBP Mom shown how to use DEBP & how to disassemble, clean, & reassemble parts. Encouraged to pump every 3 hrs. Suggested mom pump before latching to evert nipples for easier latching. Newborn feeding habits, behavior, STS, I&O, positioning, support reviewed. Mom encouraged to feed baby 8-12 times/24 hours and with feeding cues.  Suggested give the formula after she BF baby. Answered mom's questions. Encouraged to call for assistance as needed.  Maternal Data Has patient been taught Hand Expression?: Yes Does the patient have breastfeeding experience prior to this delivery?: No  Feeding    LATCH Score Latch: Repeated attempts needed to sustain latch, nipple held in mouth throughout feeding, stimulation needed to elicit sucking reflex.  Audible Swallowing: None  Type of Nipple: Everted at rest and after stimulation (very short shaft)  Comfort (Breast/Nipple): Soft / non-tender (edema to areola around nipple)  Hold (Positioning): Full assist, staff holds infant at breast  LATCH Score: 5   Lactation Tools Discussed/Used Tools: Pump Breast pump type: Double-Electric Breast Pump Pump Education: Setup, frequency, and cleaning;Milk Storage Reason for Pumping: mom wants to pump and bottle feed. Pumping frequency: q 3hr Pumped volume: 0 mL  Interventions Interventions: Breast feeding basics reviewed;Assisted with latch;Skin to skin;Hand express;Pre-pump if needed;Reverse pressure;Breast compression;Adjust position;Support pillows;Position options;DEBP;LC Services brochure  Discharge    Consult Status Consult Status:  Follow-up Date: 10/07/23 Follow-up type: In-patient    Charyl Dancer 10/06/2023, 9:19 PM

## 2023-10-06 NOTE — Progress Notes (Signed)
Christine Wells is a 22 y.o. G1P0 at [redacted]w[redacted]d admitted for induction of labor for maternal obesity.  Subjective:  Patient is comfortable with epidural, feels vaginal pressure.   Objective: BP (!) 117/97   Pulse (!) 115   Temp 97.7 F (36.5 C) (Oral)   Resp 18   Ht 5\' 6"  (1.676 m)   Wt (!) 144.2 kg   LMP 01/04/2023   SpO2 99%   BMI 51.31 kg/m  No intake/output data recorded. No intake/output data recorded.  FHT:  FHR: 150 bpm, variability: moderate,  accelerations:  Present,  decelerations:  Present Late. UC:   regular, every 2 to 3 minutes SVE:   Dilation: 5 Effacement (%): 80 Station: -2 Exam by:: Dr. Sallye Ober.  AROM done after obtaining maternal verbal consent. Copius clear fluid released.  Labs: Lab Results  Component Value Date   WBC 11.6 (H) 10/05/2023   HGB 10.8 (L) 10/05/2023   HCT 34.5 (L) 10/05/2023   MCV 73.2 (L) 10/05/2023   PLT 230 10/05/2023    Assessment / Plan: 22 y.o. G1P0 at [redacted]w[redacted]d admitted for induction of labor for  maternal obesity,   Labor:  S/p 3 vaginal cytotecs, on pitocin, s/p AROM.    Preeclampsia:   With gestational HTN, negative preeclampsia labs.  Fetal Wellbeing:  Category II, patient undergoing positional changes and IV fluid bolus.  Pain Control:  Epidural.  I/D:   GBS positive on penicillin.  Anticipated MOD:  NSVD.  Prescilla Sours, MD 10/06/2023, 12:22 AM

## 2023-10-07 LAB — CBC
HCT: 31.9 % — ABNORMAL LOW (ref 36.0–46.0)
Hemoglobin: 9.9 g/dL — ABNORMAL LOW (ref 12.0–15.0)
MCH: 23.4 pg — ABNORMAL LOW (ref 26.0–34.0)
MCHC: 31 g/dL (ref 30.0–36.0)
MCV: 75.4 fL — ABNORMAL LOW (ref 80.0–100.0)
Platelets: 268 10*3/uL (ref 150–400)
RBC: 4.23 MIL/uL (ref 3.87–5.11)
RDW: 16.3 % — ABNORMAL HIGH (ref 11.5–15.5)
WBC: 8.4 10*3/uL (ref 4.0–10.5)
nRBC: 0 % (ref 0.0–0.2)

## 2023-10-07 NOTE — Lactation Note (Signed)
This note was copied from a baby's chart. Lactation Consultation Note  Patient Name: Christine Wells [id number] Date: 10/07/2023 Age:22 Reason for consult: Follow-up assessment;Term;Primapara  Visited with P1 parent for follow up consult. LC offered to assist with latch, and parent consented (see LATCH score). Parent confirmed she wants to breastfeed while in the hospital and switch to exclusively pumping once she goes home. LC emphasized importance of breast stimulation 8-12x in a 24 hour period and feeding infant on demand (whether bottle feeding or breastfeeding).   Feeding Plan 1) Breastfeed infant skin to skin every 2-3 hours or sooner if infant cues. 2) If choosing to supplement with formula, use DEBP for 15 minutes to establish milk supply. 3) Call RN/LC for breastfeeding assistance.   Maternal Data Has patient been taught Hand Expression?: Yes Does the patient have breastfeeding experience prior to this delivery?: No  Feeding Mother's Current Feeding Choice: Breast Milk and Formula Nipple Type: Nfant Standard Flow (white)  LATCH Score Latch: Repeated attempts needed to sustain latch, nipple held in mouth throughout feeding, stimulation needed to elicit sucking reflex.  Audible Swallowing: A few with stimulation  Type of Nipple: Everted at rest and after stimulation  Comfort (Breast/Nipple): Soft / non-tender  Hold (Positioning): No assistance needed to correctly position infant at breast.  LATCH Score: 8   Lactation Tools Discussed/Used    Interventions Interventions: Breast feeding basics reviewed;Assisted with latch;Skin to skin;Breast massage;Hand express;Reverse pressure;Pre-pump if needed;Breast compression;Adjust position;Support pillows;Position options;Education  Discharge    Consult Status Consult Status: Follow-up Date: 10/08/23 Follow-up type: In-patient    Antionette Char 10/07/2023, 11:27 AM

## 2023-10-07 NOTE — Progress Notes (Signed)
PPD# 1 SVD w/ 1st degree Information for the patient's newborn:  Jaquelynn, Wanamaker [[id number]]  female  Baby Name Aza Circumcision Yes   S:   Reports feeling anxious about caring for baby. Discussed concerns and managed expectations Tolerating PO fluid and solids No nausea or vomiting Bleeding is light, no clots Pain controlled with acetaminophen and ibuprofen (OTC) Up ad lib / ambulatory / voiding w/o difficulty Feeding: Breast    O:   VS: BP 129/68 (BP Location: Right Arm)   Pulse 76   Temp 97.9 F (36.6 C) (Oral)   Resp 19   Ht 5\' 6"  (1.676 m)   Wt (!) 144.2 kg   LMP 01/04/2023   SpO2 99%   Breastfeeding Unknown   BMI 51.31 kg/m   LABS:  Recent Labs    10/05/23 1337 10/07/23 0607  WBC 11.6* 8.4  HGB 10.8* 9.9*  PLT 230 268   Blood type: --/--/A POS (10/09 0058) Rubella: Immune (03/25 0000)                      I&O: Intake/Output      10/10 0701 10/11 0700 10/11 0701 10/12 0700   P.O. 600    I.V. (mL/kg) 558 (3.9)    Other 122.8    Total Intake(mL/kg) 1280.8 (8.9)    Urine (mL/kg/hr) 1125 (0.3)    Blood     Total Output 1125    Net +155.8           Physical Exam: Alert and oriented X3 Lungs: Clear and unlabored Heart: regular rate and rhythm / no mumurs Abdomen: soft, non-tender, non-distended  Fundus: firm, non-tender, U-2 Perineum: mildly edematous, no hemorrhoids Lochia: appropriate Extremities: trace edema, negative for calf pain, tenderness, or cords    A:  PPD # 1  Normal exam  P:  Routine postpartum orders Anticipate D/C on PP day 2 Plan reviewed w/ Dr. Evorn Gong, DNP, CNM 10/07/2023, 5:58 PM

## 2023-10-08 DIAGNOSIS — O135 Gestational [pregnancy-induced] hypertension without significant proteinuria, complicating the puerperium: Secondary | ICD-10-CM | POA: Diagnosis not present

## 2023-10-08 MED ORDER — NIFEDIPINE ER OSMOTIC RELEASE 30 MG PO TB24
30.0000 mg | ORAL_TABLET | Freq: Every day | ORAL | Status: DC
  Administered 2023-10-08: 30 mg via ORAL
  Filled 2023-10-08: qty 1

## 2023-10-08 MED ORDER — NIFEDIPINE ER OSMOTIC RELEASE 30 MG PO TB24: 30.0000 mg | ORAL_TABLET | Freq: Every day | ORAL | 0 refills | Status: AC

## 2023-10-08 MED ORDER — INFLUENZA VIRUS VACC SPLIT PF (FLUZONE) 0.5 ML IM SUSY
0.5000 mL | PREFILLED_SYRINGE | INTRAMUSCULAR | Status: AC
  Administered 2023-10-08: 0.5 mL via INTRAMUSCULAR
  Filled 2023-10-08: qty 0.5

## 2023-10-08 MED ORDER — IBUPROFEN 600 MG PO TABS: 600.0000 mg | ORAL_TABLET | Freq: Four times a day (QID) | ORAL | 0 refills | Status: AC

## 2023-10-08 MED ORDER — ACETAMINOPHEN 325 MG PO TABS: 650.0000 mg | ORAL_TABLET | Freq: Four times a day (QID) | ORAL | Status: AC | PRN

## 2023-10-08 NOTE — Discharge Summary (Signed)
Postpartum Discharge Summary  Date of Service updated 10/08/23    Patient Name: Christine Wells DOB: 07-15-2001 MRN: [id number]  Date of admission: 10/05/2023 Delivery date:10/06/2023 Delivering provider: Hoover Browns Date of discharge: 10/08/2023  Admitting diagnosis: Morbid obesity (HCC) [E66.01] Intrauterine pregnancy: [redacted]w[redacted]d     Secondary diagnosis:  Principal Problem:   Morbid obesity (HCC) Active Problems:   Large for gestational age fetus affecting management of mother, antepartum   Gestational hypertension without significant proteinuria, postpartum   SVD (spontaneous vaginal delivery)  Additional problems: none    Discharge diagnosis: Term Pregnancy Delivered and Gestational Hypertension                                              Post partum procedures: none Augmentation: AROM, Pitocin, and Cytotec Complications: None  Hospital course: Induction of Labor With Vaginal Delivery   22 y.o. yo G1P1001 at [redacted]w[redacted]d was admitted to the hospital 10/05/2023 for induction of labor.  Indication for induction:  BMI 51 and suspected large for gestational age fetus .  Patient had an labor course complicated by NONE Membrane Rupture Time/Date: 11:35 PM,10/06/2023  Delivery Method:Vaginal, Spontaneous Operative Delivery:N/A Episiotomy: None Lacerations:  1st degree Details of delivery can be found in separate delivery note.  Patient had a postpartum course complicated by moderate range blood pressure without neurological symptoms. Diagnosed with gestational hypertension and started on procardia XL 30 mg daily by mouth. Patient is discharged home 10/08/23.  Newborn Data: Birth date:10/06/2023 Birth time:3:34 AM Gender:Female Living status:Living Apgars:8 ,9  Weight:3380 g  Magnesium Sulfate received: No BMZ received: No Rhophylac:N/A MMR:N/A Transfusion:No Immunizations administered: There is no immunization history for the selected administration types on file for this  patient.  Physical exam  Vitals:   10/07/23 0901 10/07/23 1132 10/07/23 1645 10/07/23 2002  BP: (!) 135/91 132/86 129/68 99/74  Pulse: 78 74 76 80  Resp: 19 18 19 17   Temp: 98 F (36.7 C) 97.9 F (36.6 C) 97.9 F (36.6 C) 97.9 F (36.6 C)  TempSrc: Oral Oral Oral Oral  SpO2: 100% 100% 99%   Weight:      Height:       General: alert, cooperative, and no distress Lochia: appropriate Uterine Fundus: firm Incision: N/A DVT Evaluation: No evidence of DVT seen on physical exam. No cords or calf tenderness. No significant calf/ankle edema. Labs: Lab Results  Component Value Date   WBC 8.4 10/07/2023   HGB 9.9 (L) 10/07/2023   HCT 31.9 (L) 10/07/2023   MCV 75.4 (L) 10/07/2023   PLT 268 10/07/2023      Latest Ref Rng & Units 10/05/2023    1:37 PM  CMP  Glucose 70 - 99 mg/dL 95   BUN 6 - 20 mg/dL 7   Creatinine 4.09 - 8.11 mg/dL 9.14   Sodium 782 - 956 mmol/L 137   Potassium 3.5 - 5.1 mmol/L 3.8   Chloride 98 - 111 mmol/L 106   CO2 22 - 32 mmol/L 22   Calcium 8.9 - 10.3 mg/dL 9.3   Total Protein 6.5 - 8.1 g/dL 6.3   Total Bilirubin 0.3 - 1.2 mg/dL 0.1   Alkaline Phos 38 - 126 U/L 266   AST 15 - 41 U/L 13   ALT 0 - 44 U/L 13    Edinburgh Score:    10/06/2023    5:21 PM  Edinburgh Postnatal Depression Scale Screening Tool  I have been able to laugh and see the funny side of things. 0  I have looked forward with enjoyment to things. 0  I have blamed myself unnecessarily when things went wrong. 0  I have been anxious or worried for no good reason. 0  I have felt scared or panicky for no good reason. 0  Things have been getting on top of me. 0  I have been so unhappy that I have had difficulty sleeping. 0  I have felt sad or miserable. 0  I have been so unhappy that I have been crying. 0  The thought of harming myself has occurred to me. 0  Edinburgh Postnatal Depression Scale Total 0      After visit meds:  Allergies as of 10/08/2023   No Known Allergies       Medication List     TAKE these medications    acetaminophen 325 MG tablet Commonly known as: Tylenol Take 2 tablets (650 mg total) by mouth every 6 (six) hours as needed (for pain scale < 4).   D3 PO Take by mouth.   ferrous sulfate 325 (65 FE) MG tablet Take 325 mg by mouth every 3 (three) days.   ibuprofen 600 MG tablet Commonly known as: ADVIL Take 1 tablet (600 mg total) by mouth every 6 (six) hours.   NIFEdipine 30 MG 24 hr tablet Commonly known as: PROCARDIA-XL/NIFEDICAL-XL Take 1 tablet (30 mg total) by mouth daily.   prenatal multivitamin Tabs tablet Take 1 tablet by mouth daily at 12 noon.         Discharge home in stable condition Infant Feeding: Breast and formula Infant Disposition:home with mother Discharge instruction: per After Visit Summary and Postpartum booklet. Activity: Advance as tolerated. Pelvic rest for 6 weeks.  Diet: routine diet Anticipated Birth Control: Unsure Postpartum Appointment:6 weeks Additional Postpartum F/U: BP check 1 week Future Appointments:No future appointments. Follow up Visit:  Follow-up Information     [facility]. Go in 6 week(s).   Specialty: Obstetrics and Gynecology Contact information: [address] [place] [address] [phone]                    10/08/2023 Roma Schanz, CNM

## 2023-10-08 NOTE — Plan of Care (Signed)

## 2023-10-28 DIAGNOSIS — Z419 Encounter for procedure for purposes other than remedying health state, unspecified: Secondary | ICD-10-CM | POA: Diagnosis not present

## 2023-11-01 ENCOUNTER — Telehealth (HOSPITAL_COMMUNITY): Payer: Self-pay

## 2023-11-01 NOTE — Telephone Encounter (Signed)
11/01/2023 1338  Name: Christine Wells MRN: [id number] DOB: Mar 01, 2001  Reason for Call:  Transition of Care Hospital Discharge Call  Contact Status: Patient Contact Status: Message  Language assistant needed: Interpreter Mode: Interpreter Not Needed        Follow-Up Questions:    Inocente Salles Postnatal Depression Scale:  In the Past 7 Days:    PHQ2-9 Depression Scale:     Discharge Follow-up:    Post-discharge interventions: NA  Signature  Signe Colt

## 2023-11-27 DIAGNOSIS — Z419 Encounter for procedure for purposes other than remedying health state, unspecified: Secondary | ICD-10-CM | POA: Diagnosis not present

## 2023-12-28 DIAGNOSIS — Z419 Encounter for procedure for purposes other than remedying health state, unspecified: Secondary | ICD-10-CM | POA: Diagnosis not present

## 2024-01-28 DIAGNOSIS — Z419 Encounter for procedure for purposes other than remedying health state, unspecified: Secondary | ICD-10-CM | POA: Diagnosis not present

## 2024-02-25 DIAGNOSIS — Z419 Encounter for procedure for purposes other than remedying health state, unspecified: Secondary | ICD-10-CM | POA: Diagnosis not present

## 2024-04-07 DIAGNOSIS — Z419 Encounter for procedure for purposes other than remedying health state, unspecified: Secondary | ICD-10-CM | POA: Diagnosis not present

## 2024-05-07 DIAGNOSIS — Z419 Encounter for procedure for purposes other than remedying health state, unspecified: Secondary | ICD-10-CM | POA: Diagnosis not present

## 2024-06-07 DIAGNOSIS — Z419 Encounter for procedure for purposes other than remedying health state, unspecified: Secondary | ICD-10-CM | POA: Diagnosis not present

## 2024-07-07 DIAGNOSIS — Z419 Encounter for procedure for purposes other than remedying health state, unspecified: Secondary | ICD-10-CM | POA: Diagnosis not present

## 2024-07-27 ENCOUNTER — Emergency Department (HOSPITAL_BASED_OUTPATIENT_CLINIC_OR_DEPARTMENT_OTHER)
Admission: EM | Admit: 2024-07-27 | Discharge: 2024-07-28 | Disposition: A | Discharge status: Home / Self Care | Attending: Emergency Medicine | Admitting: Emergency Medicine

## 2024-07-27 ENCOUNTER — Encounter (HOSPITAL_BASED_OUTPATIENT_CLINIC_OR_DEPARTMENT_OTHER): Payer: Self-pay

## 2024-07-27 ENCOUNTER — Other Ambulatory Visit: Payer: Self-pay

## 2024-07-27 ENCOUNTER — Emergency Department (HOSPITAL_BASED_OUTPATIENT_CLINIC_OR_DEPARTMENT_OTHER)

## 2024-07-27 DIAGNOSIS — R509 Fever, unspecified: Secondary | ICD-10-CM | POA: Diagnosis not present

## 2024-07-27 DIAGNOSIS — K760 Fatty (change of) liver, not elsewhere classified: Secondary | ICD-10-CM | POA: Diagnosis not present

## 2024-07-27 DIAGNOSIS — N3000 Acute cystitis without hematuria: Secondary | ICD-10-CM | POA: Diagnosis not present

## 2024-07-27 DIAGNOSIS — J029 Acute pharyngitis, unspecified: Secondary | ICD-10-CM | POA: Diagnosis not present

## 2024-07-27 LAB — URINALYSIS, ROUTINE W REFLEX MICROSCOPIC
Bilirubin Urine: NEGATIVE
Glucose, UA: NEGATIVE mg/dL
Hgb urine dipstick: NEGATIVE
Ketones, ur: 15 mg/dL — AB
Nitrite: NEGATIVE
Protein, ur: 30 mg/dL — AB
Specific Gravity, Urine: 1.04 — ABNORMAL HIGH (ref 1.005–1.030)
pH: 6 (ref 5.0–8.0)

## 2024-07-27 LAB — CBC WITH DIFFERENTIAL/PLATELET
Abs Immature Granulocytes: 0.06 K/uL (ref 0.00–0.07)
Basophils Absolute: 0 K/uL (ref 0.0–0.1)
Basophils Relative: 0 %
Eosinophils Absolute: 0 K/uL (ref 0.0–0.5)
Eosinophils Relative: 0 %
HCT: 35.7 % — ABNORMAL LOW (ref 36.0–46.0)
Hemoglobin: 10.8 g/dL — ABNORMAL LOW (ref 12.0–15.0)
Immature Granulocytes: 0 %
Lymphocytes Relative: 7 %
Lymphs Abs: 1.2 K/uL (ref 0.7–4.0)
MCH: 21.5 pg — ABNORMAL LOW (ref 26.0–34.0)
MCHC: 30.3 g/dL (ref 30.0–36.0)
MCV: 71.1 fL — ABNORMAL LOW (ref 80.0–100.0)
Monocytes Absolute: 1.1 K/uL — ABNORMAL HIGH (ref 0.1–1.0)
Monocytes Relative: 6 %
Neutro Abs: 15.2 K/uL — ABNORMAL HIGH (ref 1.7–7.7)
Neutrophils Relative %: 87 %
Platelets: 311 K/uL (ref 150–400)
RBC: 5.02 MIL/uL (ref 3.87–5.11)
RDW: 16.2 % — ABNORMAL HIGH (ref 11.5–15.5)
WBC: 17.6 K/uL — ABNORMAL HIGH (ref 4.0–10.5)
nRBC: 0 % (ref 0.0–0.2)

## 2024-07-27 LAB — COMPREHENSIVE METABOLIC PANEL WITH GFR
ALT: 25 U/L (ref 0–44)
AST: 25 U/L (ref 15–41)
Albumin: 4.5 g/dL (ref 3.5–5.0)
Alkaline Phosphatase: 73 U/L (ref 38–126)
Anion gap: 16 — ABNORMAL HIGH (ref 5–15)
BUN: 9 mg/dL (ref 6–20)
CO2: 21 mmol/L — ABNORMAL LOW (ref 22–32)
Calcium: 9.6 mg/dL (ref 8.9–10.3)
Chloride: 99 mmol/L (ref 98–111)
Creatinine, Ser: 0.88 mg/dL (ref 0.44–1.00)
GFR, Estimated: >60 mL/min (ref >60)
Glucose, Bld: 94 mg/dL (ref 70–99)
Potassium: 3.6 mmol/L (ref 3.5–5.1)
Sodium: 136 mmol/L (ref 135–145)
Total Bilirubin: 0.4 mg/dL (ref 0.0–1.2)
Total Protein: 8 g/dL (ref 6.5–8.1)

## 2024-07-27 LAB — RESP PANEL BY RT-PCR (RSV, FLU A&B, COVID)  RVPGX2
Influenza A by PCR: NEGATIVE
Influenza B by PCR: NEGATIVE
Resp Syncytial Virus by PCR: NEGATIVE
SARS Coronavirus 2 by RT PCR: NEGATIVE

## 2024-07-27 LAB — PREGNANCY, URINE: Preg Test, Ur: NEGATIVE

## 2024-07-27 MED ORDER — ACETAMINOPHEN 325 MG PO TABS
650.0000 mg | ORAL_TABLET | Freq: Once | ORAL | Status: AC
  Administered 2024-07-27: 650 mg via ORAL
  Filled 2024-07-27: qty 2

## 2024-07-27 MED ORDER — SODIUM CHLORIDE 0.9 % IV SOLN
1.0000 g | Freq: Once | INTRAVENOUS | Status: AC
  Administered 2024-07-27: 1 g via INTRAVENOUS
  Filled 2024-07-27: qty 10

## 2024-07-27 MED ORDER — SODIUM CHLORIDE 0.9 % IV BOLUS
500.0000 mL | Freq: Once | INTRAVENOUS | Status: AC
  Administered 2024-07-27: 500 mL via INTRAVENOUS

## 2024-07-27 MED ORDER — IOHEXOL 300 MG/ML  SOLN
125.0000 mL | Freq: Once | INTRAMUSCULAR | Status: AC | PRN
  Administered 2024-07-27: 120 mL via INTRAVENOUS

## 2024-07-27 NOTE — ED Triage Notes (Signed)
 Pt reports fever/chills starting yesterday. Pt reports taking Tylenol  x3. Pt endorses neck stiffness, nausea, urinary incontinence.

## 2024-07-27 NOTE — ED Provider Notes (Incomplete)
 [place] EMERGENCY DEPARTMENT AT [facility] Provider Note   CSN: [id number] Arrival date & time: 07/27/24  8084     Patient presents with: Fever   Christine Wells is a 23 y.o. female.   23 year old female presents ED with 1 day fever, chills, urinary urgency and incontinence.  Patient reports she was out in the restroom and states she gets to the bathroom she will urinate before getting to the toilet.  Patient denies any numbness, tingling, weakness, or loss of feeling.  Patient reports having her son 9 months ago but has not had any issues with pelvic floor since birth.  Patient denies ever having issues like this before.  Patient reports no prescribe medications, allergies, or known medical history.  Patient denies any recent sick contacts.   Fever Associated symptoms: chills, dysuria and sore throat   Associated symptoms: no cough and no headaches        Prior to Admission medications   Medication Sig Start Date End Date Taking? Authorizing Provider  acetaminophen  (TYLENOL ) 325 MG tablet Take 2 tablets (650 mg total) by mouth every 6 (six) hours as needed (for pain scale < 4). 10/08/23   Grice, Vivian B, CNM  Cholecalciferol (D3 PO) Take by mouth.    [provider]  ferrous sulfate 325 (65 FE) MG tablet Take 325 mg by mouth every 3 (three) days.    [provider]  ibuprofen  (ADVIL ) 600 MG tablet Take 1 tablet (600 mg total) by mouth every 6 (six) hours. 10/08/23   Grice, Vivian B, CNM  NIFEdipine  (PROCARDIA -XL/NIFEDICAL-XL) 30 MG 24 hr tablet Take 1 tablet (30 mg total) by mouth daily. 10/08/23 11/07/23  Grice, Vivian B, CNM  Prenatal Vit-Fe Fumarate-FA (PRENATAL MULTIVITAMIN) TABS tablet Take 1 tablet by mouth daily at 12 noon.    [provider]    Allergies: Patient has no known allergies.    Review of Systems  Constitutional:  Positive for chills, fatigue and fever. Negative for diaphoresis.  HENT:  Positive for sore throat.    Respiratory:  Negative for cough, chest tightness, shortness of breath and wheezing.   Genitourinary:  Positive for dysuria, frequency and urgency. Negative for flank pain, hematuria, menstrual problem, pelvic pain, vaginal bleeding, vaginal discharge and vaginal pain.  Musculoskeletal:  Positive for neck pain and neck stiffness.  Neurological:  Negative for dizziness, speech difficulty, light-headedness, numbness and headaches.    Updated Vital Signs BP 126/78 (BP Location: Right Arm)   Pulse (!) 115   Temp 99.2 F (37.3 C) (Oral)   Resp 18   Ht 5' 6 (1.676 m)   Wt 131.5 kg   LMP 07/06/2024   SpO2 100%   BMI 46.81 kg/m   Physical Exam Constitutional:      Appearance: Normal appearance.  HENT:     Head: Normocephalic and atraumatic.  Cardiovascular:     Rate and Rhythm: Normal rate.  Pulmonary:     Breath sounds: Normal breath sounds.  Musculoskeletal:        General: Normal range of motion.     Cervical back: Tenderness present.  Neurological:     General: No focal deficit present.     Mental Status: She is alert and oriented to person, place, and time.     (all labs ordered are listed, but only abnormal results are displayed) Labs Reviewed  URINALYSIS, ROUTINE W REFLEX MICROSCOPIC - Abnormal; Notable for the following components:      Result Value   APPearance  HAZY (*)    Specific Gravity, Urine 1.040 (*)    Ketones, ur 15 (*)    Protein, ur 30 (*)    Leukocytes,Ua LARGE (*)    Bacteria, UA RARE (*)    All other components within normal limits  CBC WITH DIFFERENTIAL/PLATELET - Abnormal; Notable for the following components:   WBC 17.6 (*)    Hemoglobin 10.8 (*)    HCT 35.7 (*)    MCV 71.1 (*)    MCH 21.5 (*)    RDW 16.2 (*)    Neutro Abs 15.2 (*)    Monocytes Absolute 1.1 (*)    All other components within normal limits  COMPREHENSIVE METABOLIC PANEL WITH GFR - Abnormal; Notable for the following components:   CO2 21 (*)    Anion gap 16 (*)    All  other components within normal limits  RESP PANEL BY RT-PCR (RSV, FLU A&B, COVID)  RVPGX2  PREGNANCY, URINE    EKG: None  Radiology: CT ABDOMEN PELVIS W CONTRAST Result Date: 07/27/2024 CLINICAL DATA:  Recurrent or complicated urinary tract infections. Fever and chills starting yesterday. Neck stiffness, nausea, urinary incontinence. EXAM: CT ABDOMEN AND PELVIS WITH CONTRAST TECHNIQUE: Multidetector CT imaging of the abdomen and pelvis was performed using the standard protocol following bolus administration of intravenous contrast. RADIATION DOSE REDUCTION: This exam was performed according to the departmental dose-optimization program which includes automated exposure control, adjustment of the mA and/or kV according to patient size and/or use of iterative reconstruction technique. CONTRAST:  OMNIPAQUE IOHEXOL 300 MG/ML  SOLN COMPARISON:  None Available. FINDINGS: Lower chest: Lung bases are clear. Hepatobiliary: Mild diffuse fatty infiltration of the liver. No focal lesions. Gallbladder and bile ducts are normal. Pancreas: Unremarkable. No pancreatic ductal dilatation or surrounding inflammatory changes. Spleen: Normal in size without focal abnormality. Adrenals/Urinary Tract: Adrenal glands are unremarkable. Kidneys are normal, without renal calculi, focal lesion, or hydronephrosis. Bladder is unremarkable. Stomach/Bowel: Stomach, small bowel, and colon are not abnormally distended. No wall thickening or inflammatory stranding. Appendix is normal. Vascular/Lymphatic: No significant vascular findings are present. No enlarged abdominal or pelvic lymph nodes. Reproductive: Uterus and bilateral adnexa are unremarkable. Small amount of free fluid in the pelvis is likely physiologic. Other: No free air in the abdomen. Abdominal wall musculature appears intact. Musculoskeletal: No acute or significant osseous findings. IMPRESSION: 1. No acute process demonstrated in the abdomen or pelvis. No evidence of  bowel obstruction or inflammation. No inflammatory changes demonstrated in the kidneys or bladder. 2. Mild diffuse fatty infiltration of the liver. 3. Small amount of free fluid in the pelvis is most likely physiologic. Electronically Signed   By: Elsie Gravely M.D.   On: 07/27/2024 23:27    {Document cardiac monitor, telemetry assessment procedure when appropriate:32947} Procedures   Medications Ordered in the ED  sodium chloride  0.9 % bolus 500 mL (0 mLs Intravenous Stopped 07/27/24 2229)  cefTRIAXone (ROCEPHIN) 1 g in sodium chloride  0.9 % 100 mL IVPB (0 g Intravenous Stopped 07/27/24 2229)  acetaminophen  (TYLENOL ) tablet 650 mg (650 mg Oral Given 07/27/24 2227)  iohexol (OMNIPAQUE) 300 MG/ML solution 125 mL (120 mLs Intravenous Contrast Given 07/27/24 2322)    Clinical Course as of 07/27/24 2353  Fri Jul 27, 2024  2117 WBC(!): 17.6 [JB]    Clinical Course User Index [JB] Myriam Fonda RAMAN, PA-C   {Click here for ABCD2, HEART and other calculators REFRESH Note before signing:1}  Medical Decision Making 23 year old female presents with fever chills since yesterday.  Patient also reports urinary incontinence.  Patient has some muscular neck pain on the left trapezius.  Patient has no CVA tenderness.  CBC noted elevated white count and low hemoglobin.  Patient has known iron deficient anemia.  UA indicated UTI.  CT abdomen indicated no acute process in the kidneys, some free fluid noted in pelvis which is indicated to be physiologic.  Tylenol  controls patient's pain and fever.  Patient reported feeling better after IV fluids and was started on Rocephin.  Pt has been diagnosed with a UTI. Pt is afebrile, no CVA tenderness, normotensive, and denies N/V. Pt to be dc home with antibiotics and instructions to follow up with PCP if symptoms persist.   Amount and/or Complexity of Data Reviewed Labs: ordered. Decision-making details documented in ED Course. Radiology:  ordered.  Risk OTC drugs. Prescription drug management.     Final diagnoses:  None    ED Discharge Orders     None

## 2024-07-27 NOTE — ED Notes (Signed)
 Patient transported to CT

## 2024-07-27 NOTE — ED Notes (Signed)
 ED Provider at bedside.

## 2024-07-27 NOTE — ED Provider Notes (Signed)
 [place] EMERGENCY DEPARTMENT AT [facility] Provider Note   CSN: [id number] Arrival date & time: 07/27/24  8084     Patient presents with: Fever   Christine Wells is a 23 y.o. female.   23 year old female presents ED with 1 day fever, chills, urinary urgency and incontinence.  Patient reports she was out in the restroom and states she gets to the bathroom she will urinate before getting to the toilet.  Patient denies any numbness, tingling, weakness, or loss of feeling.  Patient reports having her son 9 months ago but has not had any issues with pelvic floor since birth.  Patient denies ever having issues like this before.  Patient reports no prescribe medications, allergies, or known medical history.  Patient denies any recent sick contacts.   Fever Associated symptoms: chills, dysuria and sore throat   Associated symptoms: no cough and no headaches        Prior to Admission medications   Medication Sig Start Date End Date Taking? Authorizing Provider  cephALEXin  (KEFLEX ) 250 MG capsule Take 1 capsule (250 mg total) by mouth 4 (four) times daily for 7 days. 07/28/24 08/04/24 Yes Myriam Fonda RAMAN, PA-C  acetaminophen  (TYLENOL ) 325 MG tablet Take 2 tablets (650 mg total) by mouth every 6 (six) hours as needed (for pain scale < 4). 10/08/23   Grice, Vivian B, CNM  Cholecalciferol (D3 PO) Take by mouth.    [provider]  ferrous sulfate 325 (65 FE) MG tablet Take 325 mg by mouth every 3 (three) days.    [provider]  ibuprofen  (ADVIL ) 600 MG tablet Take 1 tablet (600 mg total) by mouth every 6 (six) hours. 10/08/23   Grice, Vivian B, CNM  NIFEdipine  (PROCARDIA -XL/NIFEDICAL-XL) 30 MG 24 hr tablet Take 1 tablet (30 mg total) by mouth daily. 10/08/23 11/07/23  Grice, Vivian B, CNM  Prenatal Vit-Fe Fumarate-FA (PRENATAL MULTIVITAMIN) TABS tablet Take 1 tablet by mouth daily at 12 noon.    [provider]    Allergies: Patient has no known allergies.     Review of Systems  Constitutional:  Positive for chills, fatigue and fever. Negative for diaphoresis.  HENT:  Positive for sore throat.   Respiratory:  Negative for cough, chest tightness, shortness of breath and wheezing.   Genitourinary:  Positive for dysuria, frequency and urgency. Negative for flank pain, hematuria, menstrual problem, pelvic pain, vaginal bleeding, vaginal discharge and vaginal pain.  Musculoskeletal:  Positive for neck pain and neck stiffness.  Neurological:  Negative for dizziness, speech difficulty, light-headedness, numbness and headaches.    Updated Vital Signs BP 122/85   Pulse (!) 104   Temp 99.2 F (37.3 C) (Oral)   Resp 18   Ht 5' 6 (1.676 m)   Wt 131.5 kg   LMP 07/06/2024   SpO2 98%   BMI 46.81 kg/m   Physical Exam Constitutional:      Appearance: Normal appearance.  HENT:     Head: Normocephalic and atraumatic.  Cardiovascular:     Rate and Rhythm: Normal rate.  Pulmonary:     Breath sounds: Normal breath sounds.  Musculoskeletal:        General: Normal range of motion.     Cervical back: Tenderness present.  Neurological:     General: No focal deficit present.     Mental Status: She is alert and oriented to person, place, and time.     (all labs ordered are listed, but only abnormal results are displayed) Labs  Reviewed  URINALYSIS, ROUTINE W REFLEX MICROSCOPIC - Abnormal; Notable for the following components:      Result Value   APPearance HAZY (*)    Specific Gravity, Urine 1.040 (*)    Ketones, ur 15 (*)    Protein, ur 30 (*)    Leukocytes,Ua LARGE (*)    Bacteria, UA RARE (*)    All other components within normal limits  CBC WITH DIFFERENTIAL/PLATELET - Abnormal; Notable for the following components:   WBC 17.6 (*)    Hemoglobin 10.8 (*)    HCT 35.7 (*)    MCV 71.1 (*)    MCH 21.5 (*)    RDW 16.2 (*)    Neutro Abs 15.2 (*)    Monocytes Absolute 1.1 (*)    All other components within normal limits  COMPREHENSIVE  METABOLIC PANEL WITH GFR - Abnormal; Notable for the following components:   CO2 21 (*)    Anion gap 16 (*)    All other components within normal limits  RESP PANEL BY RT-PCR (RSV, FLU A&B, COVID)  RVPGX2  PREGNANCY, URINE    EKG: None  Radiology: CT ABDOMEN PELVIS W CONTRAST Result Date: 07/27/2024 CLINICAL DATA:  Recurrent or complicated urinary tract infections. Fever and chills starting yesterday. Neck stiffness, nausea, urinary incontinence. EXAM: CT ABDOMEN AND PELVIS WITH CONTRAST TECHNIQUE: Multidetector CT imaging of the abdomen and pelvis was performed using the standard protocol following bolus administration of intravenous contrast. RADIATION DOSE REDUCTION: This exam was performed according to the departmental dose-optimization program which includes automated exposure control, adjustment of the mA and/or kV according to patient size and/or use of iterative reconstruction technique. CONTRAST:  OMNIPAQUE  IOHEXOL  300 MG/ML  SOLN COMPARISON:  None Available. FINDINGS: Lower chest: Lung bases are clear. Hepatobiliary: Mild diffuse fatty infiltration of the liver. No focal lesions. Gallbladder and bile ducts are normal. Pancreas: Unremarkable. No pancreatic ductal dilatation or surrounding inflammatory changes. Spleen: Normal in size without focal abnormality. Adrenals/Urinary Tract: Adrenal glands are unremarkable. Kidneys are normal, without renal calculi, focal lesion, or hydronephrosis. Bladder is unremarkable. Stomach/Bowel: Stomach, small bowel, and colon are not abnormally distended. No wall thickening or inflammatory stranding. Appendix is normal. Vascular/Lymphatic: No significant vascular findings are present. No enlarged abdominal or pelvic lymph nodes. Reproductive: Uterus and bilateral adnexa are unremarkable. Small amount of free fluid in the pelvis is likely physiologic. Other: No free air in the abdomen. Abdominal wall musculature appears intact. Musculoskeletal: No acute or  significant osseous findings. IMPRESSION: 1. No acute process demonstrated in the abdomen or pelvis. No evidence of bowel obstruction or inflammation. No inflammatory changes demonstrated in the kidneys or bladder. 2. Mild diffuse fatty infiltration of the liver. 3. Small amount of free fluid in the pelvis is most likely physiologic. Electronically Signed   By: Elsie Gravely M.D.   On: 07/27/2024 23:27    Procedures   Medications Ordered in the ED  sodium chloride  0.9 % bolus 500 mL (0 mLs Intravenous Stopped 07/27/24 2229)  cefTRIAXone  (ROCEPHIN ) 1 g in sodium chloride  0.9 % 100 mL IVPB (0 g Intravenous Stopped 07/27/24 2229)  acetaminophen  (TYLENOL ) tablet 650 mg (650 mg Oral Given 07/27/24 2227)  iohexol  (OMNIPAQUE ) 300 MG/ML solution 125 mL (120 mLs Intravenous Contrast Given 07/27/24 2322)    Clinical Course as of 07/28/24 0016  Fri Jul 27, 2024  2117 WBC(!): 17.6 [JB]    Clinical Course User Index [JB] Myriam Fonda RAMAN, PA-C  Medical Decision Making 23 year old female presents with fever chills since yesterday.  Patient also reports urinary incontinence with a foul odor of urine..  Patient has some muscular neck pain on the left trapezius on palpation.  Patient has no CVA tenderness.  Patient denies shortness of breath, dizziness, nausea, vomiting.  CBC noted elevated white count and low hemoglobin.  Patient has known iron deficient anemia.  UA indicated UTI.  Patient denies hematuria or dysuria.  CT abdomen indicated no acute process in the kidneys, some free fluid noted in pelvis which is indicated to be physiologic.  Tylenol  controls patient's pain and fever.  Patient reported feeling better after IV fluids and was started on Rocephin .  Pt has been diagnosed with a UTI. Pt has no CVA tenderness, normotensive, and denies N/V. Pt to be dc home with antibiotics and instructions to follow up with PCP if symptoms persist.  Patient agrees with treatment plan and  advised to seek medical care if symptoms worsen.  Amount and/or Complexity of Data Reviewed Labs: ordered. Decision-making details documented in ED Course. Radiology: ordered.  Risk OTC drugs. Prescription drug management.     Final diagnoses:  Acute cystitis without hematuria    ED Discharge Orders          Ordered    cephALEXin  (KEFLEX ) 250 MG capsule  4 times daily        07/28/24 0006               Taviana Westergren S, PA-C 07/28/24 0016    Doretha Folks, MD 07/30/24 [phone]

## 2024-07-28 MED ORDER — CEPHALEXIN 250 MG PO CAPS: 250.0000 mg | ORAL_CAPSULE | Freq: Four times a day (QID) | ORAL | 0 refills | Status: AC

## 2024-07-28 NOTE — ED Notes (Signed)
Reviewed discharge instructions, medications, and home care with pt. Pt verbalized understanding and had no further questions. Pt exited ED without complications.

## 2024-08-07 DIAGNOSIS — Z419 Encounter for procedure for purposes other than remedying health state, unspecified: Secondary | ICD-10-CM | POA: Diagnosis not present

## 2024-09-07 DIAGNOSIS — Z419 Encounter for procedure for purposes other than remedying health state, unspecified: Secondary | ICD-10-CM | POA: Diagnosis not present

## 2024-10-07 DIAGNOSIS — Z419 Encounter for procedure for purposes other than remedying health state, unspecified: Secondary | ICD-10-CM | POA: Diagnosis not present

## 2024-12-07 DIAGNOSIS — Z419 Encounter for procedure for purposes other than remedying health state, unspecified: Secondary | ICD-10-CM | POA: Diagnosis not present

## 2025-02-04 ENCOUNTER — Ambulatory Visit: Admitting: Family Medicine
